# Patient Record
Sex: Male | Born: 1975 | Race: Black or African American | Hispanic: No | Marital: Single | Smoking: Current every day smoker
Health system: Southern US, Community
[De-identification: ages and names within clinical notes are randomized; demographics above are authoritative.]

## PROBLEM LIST (undated history)

## (undated) DIAGNOSIS — Z720 Tobacco use: Secondary | ICD-10-CM

## (undated) DIAGNOSIS — G8929 Other chronic pain: Secondary | ICD-10-CM

## (undated) DIAGNOSIS — I10 Essential (primary) hypertension: Secondary | ICD-10-CM

## (undated) DIAGNOSIS — M549 Dorsalgia, unspecified: Secondary | ICD-10-CM

## (undated) HISTORY — PX: BACK SURGERY: SHX140

---

## 2016-09-12 ENCOUNTER — Emergency Department (HOSPITAL_COMMUNITY)
Admission: EM | Admit: 2016-09-12 | Discharge: 2016-09-12 | Disposition: A | Discharge status: Home / Self Care | Payer: Medicaid Other | Attending: Emergency Medicine | Admitting: Emergency Medicine

## 2016-09-12 ENCOUNTER — Encounter (HOSPITAL_COMMUNITY): Payer: Self-pay

## 2016-09-12 DIAGNOSIS — F17210 Nicotine dependence, cigarettes, uncomplicated: Secondary | ICD-10-CM | POA: Diagnosis not present

## 2016-09-12 DIAGNOSIS — K0889 Other specified disorders of teeth and supporting structures: Secondary | ICD-10-CM | POA: Insufficient documentation

## 2016-09-12 MED ORDER — PENICILLIN V POTASSIUM 500 MG PO TABS: 500.0000 mg | ORAL_TABLET | Freq: Four times a day (QID) | ORAL | 0 refills | Status: DC

## 2016-09-12 MED ORDER — OMEPRAZOLE 20 MG PO CPDR: 20.0000 mg | DELAYED_RELEASE_CAPSULE | Freq: Every day | ORAL | 0 refills | Status: DC

## 2016-09-12 NOTE — ED Triage Notes (Signed)
Pt reports right side dental pain. He reports eating something last week and tooth has been sore since. He reports pain on the side of his face as well.

## 2016-09-12 NOTE — ED Notes (Signed)
See PA note for assessment  

## 2016-09-12 NOTE — ED Provider Notes (Signed)
[facility]-EMERGENCY DEPT Provider Note   CSN: [id number] Arrival date & time: 09/12/16  1054  By signing my name below, I, Alyssa GroveMartin Green, attest that this documentation has been prepared under the direction and in the presence of Roxy Horsemanobert Lenisha Lacap, PA-C. Electronically Signed: Alyssa GroveMartin Green, ED Scribe. 09/12/16. 11:44 AM.  History   Chief Complaint Chief Complaint  Patient presents with  . Dental Pain   The history is provided by the patient. No language interpreter was used.   HPI Comments: Curtis Gill is a 40 y.o. male who presents to the Emergency Department complaining of gradual onset, constant, moderate right upper dental pain for 3 weeks PTA. He states he at 3 days ago and his tooth has been sore since then. Pt is not from [place] and does not return to [place] to see his dentist until 09/16/2016 at the earliest. He denies allergies to antibiotics. Pt has tried Ibuprofen with no relief, but notes Ibuprofen causes an upset stomach.  History reviewed. No pertinent past medical history.  There are no active problems to display for this patient.   History reviewed. No pertinent surgical history.     Home Medications    Prior to Admission medications   Not on File    Family History History reviewed. No pertinent family history.  Social History Social History  Substance Use Topics  . Smoking status: Current Every Day Smoker    Packs/day: 0.50    Types: Cigarettes  . Smokeless tobacco: Never Used  . Alcohol use Yes     Comment: occ     Allergies   Ibuprofen   Review of Systems Review of Systems  Constitutional: Negative for fever.  HENT: Positive for dental problem.      Physical Exam Updated Vital Signs BP (!) 142/106 (BP Location: Left Arm)   Pulse 72   Temp 98.4 F (36.9 C) (Oral)   Resp 18   Ht 6\' 5"  (1.956 m)   Wt 220 lb (99.8 kg)   SpO2 100%   BMI 26.09 kg/m   Physical Exam Physical Exam  Constitutional: Pt appears well-developed and  well-nourished.  HENT:  Head: Normocephalic.  Right Ear: Tympanic membrane, external ear and ear canal normal.  Left Ear: Tympanic membrane, external ear and ear canal normal.  Nose: Nose normal. Right sinus exhibits no maxillary sinus tenderness and no frontal sinus tenderness. Left sinus exhibits no maxillary sinus tenderness and no frontal sinus tenderness.  Mouth/Throat: Uvula is midline, oropharynx is clear and moist and mucous membranes are normal. No oral lesions. No uvula swelling or lacerations. No oropharyngeal exudate, posterior oropharyngeal edema, posterior oropharyngeal erythema or tonsillar abscesses.  Poor dentition No gingival swelling, fluctuance or induration No gross abscess  No sublingual edema, tenderness to palpation, or sign of Ludwig's angina, or deep space infection Pain at right upper 1st molar Eyes: Conjunctivae are normal. Pupils are equal, round, and reactive to light. Right eye exhibits no discharge. Left eye exhibits no discharge.  Neck: Normal range of motion. Neck supple.  No stridor Handling secretions without difficulty No nuchal rigidity No cervical lymphadenopathy Cardiovascular: Normal rate, regular rhythm and normal heart sounds.   Pulmonary/Chest: Effort normal. No respiratory distress.  Equal chest rise  Abdominal: Soft. Bowel sounds are normal. Pt exhibits no distension. There is no tenderness.  Lymphadenopathy: Pt has no cervical adenopathy.  Neurological: Pt is alert and oriented x 4  Skin: Skin is warm and dry.  Psychiatric: Pt has a normal mood and affect.  Nursing  note and vitals reviewed.     ED Treatments / Results  DIAGNOSTIC STUDIES: Oxygen Saturation is 100% on RA, normal by my interpretation.    COORDINATION OF CARE: 11:42 AM Discussed treatment plan with pt at bedside which includes Antibiotics and Prilosec and pt agreed to plan.  Labs (all labs ordered are listed, but only abnormal results are displayed) Labs Reviewed -  No data to display  EKG  EKG Interpretation None       Radiology No results found.  Procedures Procedures (including critical care time)  Medications Ordered in ED Medications - No data to display   Initial Impression / Assessment and Plan / ED Course  I have reviewed the triage vital signs and the nursing notes.  Pertinent labs & imaging results that were available during my care of the patient were reviewed by me and considered in my medical decision making (see chart for details).  Clinical Course     Patient with dentalgia.  No abscess requiring immediate incision and drainage.  Exam not concerning for Ludwig's angina or pharyngeal abscess.  Will treat with penicillin. Pt instructed to follow-up with dentist.  Discussed return precautions. Pt safe for discharge.   Final Clinical Impressions(s) / ED Diagnoses   Final diagnoses:  Pain, dental    New Prescriptions Discharge Medication List as of 09/12/2016 11:45 AM    START taking these medications   Details  omeprazole (PRILOSEC) 20 MG capsule Take 1 capsule (20 mg total) by mouth daily., Starting Fri 09/12/2016, Print    penicillin v potassium (VEETID) 500 MG tablet Take 1 tablet (500 mg total) by mouth 4 (four) times daily., Starting Fri 09/12/2016, Print       I personally performed the services described in this documentation, which was scribed in my presence. The recorded information has been reviewed and is accurate.      Roxy Horsemanobert Xaden Kaufman, PA-C 09/12/16 1156    Mancel BaleElliott Wentz, MD 09/12/16 [phone]

## 2018-02-15 ENCOUNTER — Encounter (HOSPITAL_COMMUNITY): Payer: Self-pay | Admitting: Emergency Medicine

## 2018-02-15 ENCOUNTER — Emergency Department (HOSPITAL_COMMUNITY)
Admission: EM | Admit: 2018-02-15 | Discharge: 2018-02-15 | Disposition: A | Discharge status: Home / Self Care | Payer: Medicaid Other | Attending: Emergency Medicine | Admitting: Emergency Medicine

## 2018-02-15 DIAGNOSIS — Z79899 Other long term (current) drug therapy: Secondary | ICD-10-CM | POA: Diagnosis not present

## 2018-02-15 DIAGNOSIS — F17210 Nicotine dependence, cigarettes, uncomplicated: Secondary | ICD-10-CM | POA: Diagnosis not present

## 2018-02-15 DIAGNOSIS — M545 Low back pain, unspecified: Secondary | ICD-10-CM

## 2018-02-15 MED ORDER — PREDNISONE 10 MG (21) PO TBPK: ORAL_TABLET | Freq: Every day | ORAL | 0 refills | Status: DC

## 2018-02-15 MED ORDER — METHOCARBAMOL 500 MG PO TABS: 500.0000 mg | ORAL_TABLET | Freq: Every evening | ORAL | 0 refills | Status: DC | PRN

## 2018-02-15 NOTE — ED Triage Notes (Signed)
Patient to ED c/o worsening back pain x 3 days - states he has chronic lower back pain r/t previous herniated lumbar discs/surgeries. Denies new injuries/trauma. Patient states his R leg feels weaker like it did before the surgery a few years ago. Denies numbness/tingling, no urinary or bowel incontinence. Patient ambulatory with steady gait.

## 2018-02-15 NOTE — ED Provider Notes (Signed)
[facility] Provider Note   CSN: [id number] Arrival date & time: 02/15/18  1804     History   Chief Complaint Chief Complaint  Patient presents with  . Back Pain    HPI Curtis Gill is a 42 y.o. male presented for evaluation of low back pain.  Patient states for the past week, he has had worsening low back pain.  This began when he woke up one morning.  He denies fall, trauma, or injury.  He has a history of back problems, had surgery for 2 herniated disks 3 years ago.  He states his pain feels similar to how it did in the past.  Pain is midline and on the right side.  He denies radiation to his legs.  He denies fevers, chills, rash, loss of bowel or bladder control, numbness, weakness, history of cancer, or history of IV drug use.  He has taken BC powders and Aleve with mild improvement of his symptoms.  [place] was [place] in [place], he does not have an orthopedic doctor or a primary care doctor in [place].  Pain is worse when he is going from lying to standing, improves when he ambulates and loosens his muscles up.  HPI  History reviewed. No pertinent past medical history.  There are no active problems to display for this patient.   History reviewed. No pertinent surgical history.      Home Medications    Prior to Admission medications   Medication Sig Start Date End Date Taking? Authorizing Provider  methocarbamol (ROBAXIN) 500 MG tablet Take 1 tablet (500 mg total) by mouth at bedtime as needed for muscle spasms. 02/15/18   Ceyda Peterka, PA-C  omeprazole (PRILOSEC) 20 MG capsule Take 1 capsule (20 mg total) by mouth daily. 09/12/16   Roxy HorsemanBrowning, Robert, PA-C  penicillin v potassium (VEETID) 500 MG tablet Take 1 tablet (500 mg total) by mouth 4 (four) times daily. 09/12/16   Roxy HorsemanBrowning, Robert, PA-C  predniSONE (STERAPRED UNI-PAK 21 TAB) 10 MG (21) TBPK tablet Take by mouth daily. Take 6 tabs by mouth daily  for 2 days,  then 5 tabs for 2 days, then 4 tabs for 2 days, then 3 tabs for 2 days, 2 tabs for 2 days, then 1 tab by mouth daily for 2 days 02/15/18   Shaurya Rawdon, PA-C    Family History No family history on file.  Social History Social History   Tobacco Use  . Smoking status: Current Every Day Smoker    Packs/day: 0.50    Types: Cigarettes  . Smokeless tobacco: Never Used  Substance Use Topics  . Alcohol use: Yes    Comment: occ  . Drug use: Not on file     Allergies   Ibuprofen   Review of Systems Review of Systems  Genitourinary: Negative for dysuria, frequency and hematuria.  Musculoskeletal: Positive for back pain.  Neurological: Negative for weakness and numbness.     Physical Exam Updated Vital Signs BP (!) 147/98   Pulse 78   Temp 98.4 F (36.9 C) (Oral)   Resp 16   Ht 6\' 5"  (1.956 m)   Wt 90.7 kg (200 lb)   SpO2 98%   BMI 23.72 kg/m   Physical Exam  Constitutional: He is oriented to person, place, and time. He appears well-developed and well-nourished. No distress.  HENT:  Head: Normocephalic and atraumatic.  Eyes: Pupils are equal, round, and reactive to light. EOM are normal.  Neck:  Normal range of motion.  Cardiovascular: Normal rate, regular rhythm and intact distal pulses.  Pulmonary/Chest: Effort normal and breath sounds normal. No respiratory distress. He has no wheezes.  Abdominal: Soft. He exhibits no distension. There is no tenderness.  Musculoskeletal: Normal range of motion. He exhibits tenderness.  Tenderness to palpation of low back along midline and right side.  No obvious deformity or step-offs.  No pain on the left side.  No pain with straight leg raise.  Pedal pulses intact bilaterally.  Sensation intact bilaterally.  DTRs symmetric bilaterally.  Soft compartments.  Patient is ambulatory.  Strength of lower extremtities equal bilaterally.  Neurological: He is alert and oriented to person, place, and time. No sensory deficit.  Skin: Skin is  warm. Capillary refill takes less than 2 seconds. No rash noted.  Psychiatric: He has a normal mood and affect.  Nursing note and vitals reviewed.    ED Treatments / Results  Labs (all labs ordered are listed, but only abnormal results are displayed) Labs Reviewed - No data to display  EKG None  Radiology No results found.  Procedures Procedures (including critical care time)  Medications Ordered in ED Medications - No data to display   Initial Impression / Assessment and Plan / ED Course  I have reviewed the triage vital signs and the nursing notes.  Pertinent labs & imaging results that were available during my care of the patient were reviewed by me and considered in my medical decision making (see chart for details).     Patient presenting for evaluation of low back pain.  Physical exam reassuring, neurovascularly intact.  No red flags for back pain.  Pain is reproducible with palpation of the musculature.  Likely musculoskeletal.  Doubt fracture, I do not believe x-rays will be beneficial.  Doubt vertebral injury, infection, spinal cord compression, myelopathy, or cauda equina syndrome.  Will treat symptomatically with prednisone pack, muscle relaxers, and stretches.  Patient given information to follow-up with orthopedics.  At this time, patient appears safe for discharge.  Return precautions given.  Patient states he understands agrees plan.   Final Clinical Impressions(s) / ED Diagnoses   Final diagnoses:  Acute right-sided low back pain without sciatica    ED Discharge Orders        Ordered    predniSONE (STERAPRED UNI-PAK 21 TAB) 10 MG (21) TBPK tablet  Daily,   Status:  Discontinued     02/15/18 2013    methocarbamol (ROBAXIN) 500 MG tablet  At bedtime PRN,   Status:  Discontinued     02/15/18 2013    methocarbamol (ROBAXIN) 500 MG tablet  At bedtime PRN     02/15/18 2015    predniSONE (STERAPRED UNI-PAK 21 TAB) 10 MG (21) TBPK tablet  Daily     02/15/18  2015       Knolan Simien, PA-C 02/15/18 2042    Mancel Bale, MD 02/23/18 [phone]

## 2018-02-15 NOTE — Discharge Instructions (Signed)
Take steroids as prescribed.  Take the entire course. Use muscle relaxers as needed for pain.  Have caution, as this may make you tired or groggy.  Do not drive or operate heavy machinery while taking this medicine. Use Tylenol as needed for pain.  You may take up to thousand milligrams 3 times a day. Do the back stretches that are provided in the paperwork. Use heat and/or ice, whichever makes your back feel better. Follow-up with orthopedics if your pain is not improving. Return to the emergency room if you develop fevers, loss of bowel or bladder control, numbness, or any new or concerning symptoms.

## 2018-02-18 ENCOUNTER — Emergency Department (HOSPITAL_COMMUNITY): Payer: Medicaid Other

## 2018-02-18 ENCOUNTER — Other Ambulatory Visit: Payer: Self-pay

## 2018-02-18 ENCOUNTER — Observation Stay (HOSPITAL_COMMUNITY)
Admission: EM | Admit: 2018-02-18 | Discharge: 2018-02-20 | Disposition: A | Discharge status: Home / Self Care | Payer: Medicaid Other | Attending: Internal Medicine | Admitting: Internal Medicine

## 2018-02-18 ENCOUNTER — Encounter (HOSPITAL_COMMUNITY): Payer: Self-pay | Admitting: Emergency Medicine

## 2018-02-18 DIAGNOSIS — R197 Diarrhea, unspecified: Secondary | ICD-10-CM | POA: Insufficient documentation

## 2018-02-18 DIAGNOSIS — R55 Syncope and collapse: Secondary | ICD-10-CM | POA: Insufficient documentation

## 2018-02-18 DIAGNOSIS — G8929 Other chronic pain: Secondary | ICD-10-CM | POA: Diagnosis not present

## 2018-02-18 DIAGNOSIS — Z720 Tobacco use: Secondary | ICD-10-CM | POA: Diagnosis present

## 2018-02-18 DIAGNOSIS — R001 Bradycardia, unspecified: Secondary | ICD-10-CM | POA: Diagnosis not present

## 2018-02-18 DIAGNOSIS — M549 Dorsalgia, unspecified: Secondary | ICD-10-CM | POA: Insufficient documentation

## 2018-02-18 DIAGNOSIS — F17210 Nicotine dependence, cigarettes, uncomplicated: Secondary | ICD-10-CM | POA: Diagnosis not present

## 2018-02-18 DIAGNOSIS — I10 Essential (primary) hypertension: Secondary | ICD-10-CM | POA: Insufficient documentation

## 2018-02-18 DIAGNOSIS — M545 Low back pain: Secondary | ICD-10-CM

## 2018-02-18 DIAGNOSIS — R110 Nausea: Secondary | ICD-10-CM | POA: Diagnosis present

## 2018-02-18 DIAGNOSIS — R112 Nausea with vomiting, unspecified: Secondary | ICD-10-CM | POA: Insufficient documentation

## 2018-02-18 HISTORY — DX: Tobacco use: Z72.0

## 2018-02-18 HISTORY — DX: Dorsalgia, unspecified: M54.9

## 2018-02-18 HISTORY — DX: Other chronic pain: G89.29

## 2018-02-18 LAB — URINALYSIS, ROUTINE W REFLEX MICROSCOPIC
Bilirubin Urine: NEGATIVE
Glucose, UA: NEGATIVE mg/dL
Ketones, ur: NEGATIVE mg/dL
Leukocytes, UA: NEGATIVE
Nitrite: NEGATIVE
PROTEIN: 30 mg/dL — AB
Specific Gravity, Urine: 1.026 (ref 1.005–1.030)
pH: 6 (ref 5.0–8.0)

## 2018-02-18 LAB — I-STAT TROPONIN, ED: TROPONIN I, POC: 0 ng/mL (ref 0.00–0.08)

## 2018-02-18 LAB — HEPATIC FUNCTION PANEL
ALT: 22 U/L (ref 17–63)
AST: 21 U/L (ref 15–41)
Albumin: 4.2 g/dL (ref 3.5–5.0)
Alkaline Phosphatase: 46 U/L (ref 38–126)
BILIRUBIN TOTAL: 0.5 mg/dL (ref 0.3–1.2)
Total Protein: 7.4 g/dL (ref 6.5–8.1)

## 2018-02-18 LAB — BASIC METABOLIC PANEL
ANION GAP: 9 (ref 5–15)
BUN: 10 mg/dL (ref 6–20)
CO2: 27 mmol/L (ref 22–32)
Calcium: 10 mg/dL (ref 8.9–10.3)
Chloride: 105 mmol/L (ref 101–111)
Creatinine, Ser: 0.9 mg/dL (ref 0.61–1.24)
GFR calc Af Amer: >60 mL/min (ref >60)
GFR calc non Af Amer: >60 mL/min (ref >60)
GLUCOSE: 97 mg/dL (ref 65–99)
Potassium: 4.2 mmol/L (ref 3.5–5.1)
Sodium: 141 mmol/L (ref 135–145)

## 2018-02-18 LAB — CBG MONITORING, ED: GLUCOSE-CAPILLARY: 119 mg/dL — AB (ref 65–99)

## 2018-02-18 LAB — CBC
HCT: 43.7 % (ref 39.0–52.0)
HEMOGLOBIN: 14.7 g/dL (ref 13.0–17.0)
MCH: 29.8 pg (ref 26.0–34.0)
MCHC: 33.6 g/dL (ref 30.0–36.0)
MCV: 88.6 fL (ref 78.0–100.0)
Platelets: 365 10*3/uL (ref 150–400)
RBC: 4.93 MIL/uL (ref 4.22–5.81)
RDW: 13.6 % (ref 11.5–15.5)
WBC: 12.4 10*3/uL — ABNORMAL HIGH (ref 4.0–10.5)

## 2018-02-18 LAB — TSH: TSH: 0.691 u[IU]/mL (ref 0.350–4.500)

## 2018-02-18 LAB — LIPASE, BLOOD: Lipase: 25 U/L (ref 11–51)

## 2018-02-18 MED ORDER — SODIUM CHLORIDE 0.9 % IV SOLN
INTRAVENOUS | Status: DC
  Administered 2018-02-18: 23:00:00 via INTRAVENOUS

## 2018-02-18 MED ORDER — METOCLOPRAMIDE HCL 5 MG/ML IJ SOLN
10.0000 mg | Freq: Once | INTRAMUSCULAR | Status: AC
  Administered 2018-02-18: 10 mg via INTRAVENOUS
  Filled 2018-02-18: qty 2

## 2018-02-18 MED ORDER — LACTATED RINGERS IV BOLUS
1000.0000 mL | Freq: Once | INTRAVENOUS | Status: AC
  Administered 2018-02-18: 1000 mL via INTRAVENOUS

## 2018-02-18 MED ORDER — ONDANSETRON 4 MG PO TBDP
4.0000 mg | ORAL_TABLET | Freq: Three times a day (TID) | ORAL | Status: DC | PRN
  Filled 2018-02-18: qty 1

## 2018-02-18 MED ORDER — FAMOTIDINE IN NACL 20-0.9 MG/50ML-% IV SOLN
20.0000 mg | Freq: Once | INTRAVENOUS | Status: AC
  Administered 2018-02-18: 20 mg via INTRAVENOUS
  Filled 2018-02-18: qty 50

## 2018-02-18 MED ORDER — ZOLPIDEM TARTRATE 5 MG PO TABS: 5.0000 mg | ORAL_TABLET | Freq: Every evening | ORAL | Status: DC | PRN

## 2018-02-18 MED ORDER — OXYCODONE-ACETAMINOPHEN 5-325 MG PO TABS
1.0000 | ORAL_TABLET | ORAL | Status: DC | PRN
  Administered 2018-02-19 (×2): 1 via ORAL
  Filled 2018-02-18 (×2): qty 1

## 2018-02-18 MED ORDER — DIPHENHYDRAMINE HCL 25 MG PO CAPS
25.0000 mg | ORAL_CAPSULE | Freq: Two times a day (BID) | ORAL | Status: DC
  Administered 2018-02-18 – 2018-02-20 (×4): 25 mg via ORAL
  Filled 2018-02-18 (×4): qty 1

## 2018-02-18 MED ORDER — SODIUM CHLORIDE 0.9 % IV BOLUS
1000.0000 mL | Freq: Once | INTRAVENOUS | Status: AC
  Administered 2018-02-18: 1000 mL via INTRAVENOUS

## 2018-02-18 MED ORDER — ACETAMINOPHEN 325 MG PO TABS
650.0000 mg | ORAL_TABLET | Freq: Four times a day (QID) | ORAL | Status: DC | PRN
  Administered 2018-02-19: 650 mg via ORAL
  Filled 2018-02-18: qty 2

## 2018-02-18 MED ORDER — ONDANSETRON 4 MG PO TBDP
4.0000 mg | ORAL_TABLET | Freq: Once | ORAL | Status: AC
  Administered 2018-02-18: 4 mg via ORAL
  Filled 2018-02-18: qty 1

## 2018-02-18 MED ORDER — FAMOTIDINE IN NACL 20-0.9 MG/50ML-% IV SOLN
20.0000 mg | Freq: Two times a day (BID) | INTRAVENOUS | Status: DC
  Administered 2018-02-19: 20 mg via INTRAVENOUS
  Filled 2018-02-18: qty 50

## 2018-02-18 MED ORDER — NICOTINE 21 MG/24HR TD PT24
21.0000 mg | MEDICATED_PATCH | Freq: Every day | TRANSDERMAL | Status: DC
  Administered 2018-02-19 – 2018-02-20 (×2): 21 mg via TRANSDERMAL
  Filled 2018-02-18 (×2): qty 1

## 2018-02-18 NOTE — ED Notes (Signed)
Patient actively vomiting.  Will provide protocol zofran

## 2018-02-18 NOTE — H&P (Signed)
History and Physical    Trueman Worlds ZOX:[id number] DOB: [birth year] DOA: 02/18/2018  Referring MD/NP/PA:   PCP: Patient, No Pcp Per   Patient coming from:  The patient is coming from home.  At baseline, pt is independent for most of ADL.     Chief Complaint: Nausea, vomiting, diarrhea  HPI: Curtis Gill is a 42 y.o. male with medical history significant of chronic back pain, GERD, tobacco abuse, who presents with nausea, vomiting, diarrhea.  Patient states that he has chronic lower back pain, which has worsened recently.  Patient was seen in ED on 02/15/2018, and was put on Robaxin and prednisone.  Patient states that he developed nausea, vomiting and diarrhea after starting taking this medications.  He vomited 5 times today.  He had a 2 loose stool bowel movement today.  Patient has some mild abdominal cramping and discomfort earlier, which has resolved.  Currently no abdominal pain.  Patient does not have fever, but has chills.  She has sweating.  Patient states that he had similar reaction to steroid in the past when he get steroid injection to lower back.  Denies any chest pain, shortness breath, cough.  No symptoms of UTI or unilateral weakness.  No recent antibiotics use. Pt states that he had lightheadedness and felt going to pass out but did not.  No unilateral weakness, numbness or tingling his extremities.  No facial droop or slurred speech.    ED Course: pt was found to have WBC 12.4, negative troponin, lipase 25, negative urinalysis, electrolytes renal function okay, bradycardia with heart rate 40 to 50s, negative chest x-ray.  Patient is placed on telemetry bed for observation.  Review of Systems:   General: no fevers, has chills, no body weight gain, has poor appetite, has fatigue HEENT: no blurry vision, hearing changes or sore throat Respiratory: no dyspnea, coughing, wheezing CV: no chest pain, no palpitations GI: has nausea, vomiting, diarrhea, no constipation and bdominal  pain,  GU: no dysuria, burning on urination, increased urinary frequency, hematuria  Ext: no leg edema Neuro: no unilateral weakness, numbness, or tingling, no vision change or hearing loss. Has lightheadedness and near syncope. Skin: no rash, no skin tear. MSK: No muscle spasm, no deformity, no limitation of range of movement in spin Heme: No easy bruising.  Travel history: No recent long distant travel.  Allergy:  Allergies  Allergen Reactions  . Prednisone     Nausea, vomiting, diarrhea   . Ibuprofen Nausea Only    Stomach upset     Past Medical History:  Diagnosis Date  . Chronic back pain   . Tobacco abuse     Past Surgical History:  Procedure Laterality Date  . BACK SURGERY      Social History:  reports that he has been smoking cigarettes.  He has been smoking about 0.50 packs per day. He has never used smokeless tobacco. He reports that he drinks alcohol. He reports that he has current or past drug history.  Family History:  Family History  Problem Relation Age of Onset  . Hyperlipidemia Mother      Prior to Admission medications   Medication Sig Start Date End Date Taking? Authorizing Provider  methocarbamol (ROBAXIN) 500 MG tablet Take 1 tablet (500 mg total) by mouth at bedtime as needed for muscle spasms. 02/15/18  Yes Caccavale, Sophia, PA-C  omeprazole (PRILOSEC) 20 MG capsule Take 1 capsule (20 mg total) by mouth daily. 09/12/16  Yes Roxy Horseman, PA-C  predniSONE (STERAPRED Carlus Pavlov  21 TAB) 10 MG (21) TBPK tablet Take by mouth daily. Take 6 tabs by mouth daily  for 2 days, then 5 tabs for 2 days, then 4 tabs for 2 days, then 3 tabs for 2 days, 2 tabs for 2 days, then 1 tab by mouth daily for 2 days 02/15/18  Yes Caccavale, Sophia, PA-C  penicillin v potassium (VEETID) 500 MG tablet Take 1 tablet (500 mg total) by mouth 4 (four) times daily. Patient not taking: Reported on 02/18/2018 09/12/16   Roxy HorsemanBrowning, Robert, PA-C    Physical Exam: Vitals:   02/18/18  2215 02/18/18 2223 02/18/18 2230 02/18/18 2300  BP: 123/72  128/73 130/72  Pulse: (!) 48 (!) 44 (!) 46 (!) 53  Resp: 16 14 15  (!) 97  Temp:   98.1 F (36.7 C) 98.1 F (36.7 C)  TempSrc:   Oral   SpO2: 98% 100% 98% 99%  Weight:    88.8 kg (195 lb 12.8 oz)  Height:    6\' 5"  (1.956 m)   General: Not in acute distress.  HEENT:       Eyes: PERRL, EOMI, no scleral icterus.       ENT: No discharge from the ears and nose, no pharynx injection, no tonsillar enlargement.        Neck: No JVD, no bruit, no mass felt. Heme: No neck lymph node enlargement. Cardiac: S1/S2, RRR, No murmurs, No gallops or rubs. Respiratory: No rales, wheezing, rhonchi or rubs. GI: Soft, nondistended, nontender, no rebound pain, no organomegaly, BS present. GU: No hematuria Ext: No pitting leg edema bilaterally. 2+DP/PT pulse bilaterally. Musculoskeletal: No joint deformities, No joint redness or warmth, no limitation of ROM in spin. Skin: No rashes.  Neuro: Alert, oriented X3, cranial nerves II-XII grossly intact, moves all extremities normally. Muscle strength 5/5 in all extremities, sensation to light touch intact. Brachial reflex 2+ bilaterally. Negative Babinski's sign.  Psych: Patient is not psychotic, no suicidal or hemocidal ideation.  Labs on Admission: I have personally reviewed following labs and imaging studies  CBC: Recent Labs  Lab 02/18/18 1116  WBC 12.4*  HGB 14.7  HCT 43.7  MCV 88.6  PLT 365   Basic Metabolic Panel: Recent Labs  Lab 02/18/18 1116  NA 141  K 4.2  CL 105  CO2 27  GLUCOSE 97  BUN 10  CREATININE 0.90  CALCIUM 10.0   GFR: Estimated Creatinine Clearance: 135.7 mL/min (by C-G formula based on SCr of 0.9 mg/dL). Liver Function Tests: Recent Labs  Lab 02/18/18 1608  AST 21  ALT 22  ALKPHOS 46  BILITOT 0.5  PROT 7.4  ALBUMIN 4.2   Recent Labs  Lab 02/18/18 1608  LIPASE 25   No results for input(s): AMMONIA in the last 168 hours. Coagulation Profile: No  results for input(s): INR, PROTIME in the last 168 hours. Cardiac Enzymes: No results for input(s): CKTOTAL, CKMB, CKMBINDEX, TROPONINI in the last 168 hours. BNP (last 3 results) No results for input(s): PROBNP in the last 8760 hours. HbA1C: No results for input(s): HGBA1C in the last 72 hours. CBG: Recent Labs  Lab 02/18/18 1558  GLUCAP 119*   Lipid Profile: No results for input(s): CHOL, HDL, LDLCALC, TRIG, CHOLHDL, LDLDIRECT in the last 72 hours. Thyroid Function Tests: Recent Labs    02/18/18 1754  TSH 0.691   Anemia Panel: No results for input(s): VITAMINB12, FOLATE, FERRITIN, TIBC, IRON, RETICCTPCT in the last 72 hours. Urine analysis:    Component Value Date/Time  COLORURINE YELLOW 02/18/2018 1744   APPEARANCEUR CLOUDY (A) 02/18/2018 1744   LABSPEC 1.026 02/18/2018 1744   PHURINE 6.0 02/18/2018 1744   GLUCOSEU NEGATIVE 02/18/2018 1744   HGBUR SMALL (A) 02/18/2018 1744   BILIRUBINUR NEGATIVE 02/18/2018 1744   KETONESUR NEGATIVE 02/18/2018 1744   PROTEINUR 30 (A) 02/18/2018 1744   NITRITE NEGATIVE 02/18/2018 1744   LEUKOCYTESUR NEGATIVE 02/18/2018 1744   Sepsis Labs: @LABRCNTIP (procalcitonin:4,lacticidven:4) )No results found for this or any previous visit (from the past 240 hour(s)).   Radiological Exams on Admission: Dg Chest Port 1 View  Result Date: 02/18/2018 CLINICAL DATA:  Bradycardia.  Weakness and dizziness. EXAM: PORTABLE CHEST 1 VIEW COMPARISON:  None. FINDINGS: The heart size and mediastinal contours are within normal limits. Both lungs are clear. The visualized skeletal structures are unremarkable. IMPRESSION: No active disease. Electronically Signed   By: Obie Dredge M.D.   On: 02/18/2018 16:00     EKG: Independently reviewed.  Sinus rhythm, bradycardia, QTC 386, no ischemic change.  Assessment/Plan Principal Problem:   Nausea vomiting and diarrhea Active Problems:   Chronic back pain   Tobacco abuse   Bradycardia   Near  syncope   Nausea vomiting and diarrhea: Etiology is not clear. Patient states that this is likely due to allergic reaction to prednisone since patient had similar reaction to steroid in the past.  Another possibility is viral gastroenteritis.  No recent antibiotics use.  No significant abdominal pain, low suspicion for C. difficile colitis.  -Placed on telemetry bed for observation -Hold prednisone and Robaxin -IV fluid: 1 L normal saline, 1 L Ringer's solution, followed by 150 cc/h -PRN Zofran for nausea. -check GI pathogen panel -Benadryl 25 mg twice daily and Pepcid 20 mg twice daily -f/u Blood culture due to leukocytosis.  Chronic back pain: -hold prednisone and Robaxin -PRN Tylenol and Percocet  Tobacco abuse: -Did counseling about importance of quitting smoking -Nicotine patch  Bradycardia and Near syncope: Patient had near syncope episode, which is most likely due to dehydration or orthostatic status secondary to dehydration, but since patient also has bradycardia, will get 2D echo to rule out any structural abnormality. -check orthostatic vital signs -IV fluid as above -2D echo -check TSH and free T4  DVT ppx: SQ Lovenox Code Status: Full code Family Communication: None at bed side.   Disposition Plan:  Anticipate discharge back to previous home environment Consults called:  none Admission status: Obs / tele     Date of Service 02/19/2018    Lorretta Harp [facility] Pager [phone]  If 7PM-7AM, please contact night-coverage [email or web address] Password [email or web address] 02/19/2018, 1:45 AM

## 2018-02-18 NOTE — ED Notes (Signed)
Pt appears more comfortable now since receiving Liter bolus and reglan. Pt still complains of some pain. Pt's HR still in the 40s sinus brady

## 2018-02-18 NOTE — ED Notes (Signed)
Pt still appears diaphoretic and HR still dropping into the 40s. 1L lactated ringers started. Pt axox4.

## 2018-02-18 NOTE — ED Triage Notes (Signed)
Pt states he was seen here a few days ago for back pain and was given prednisone and robaxin for his treatment. Pt started having abd cramping, n/v/diarrhea. Pt having hot/cold sweats, feeling like he may pass out. Concerned he is having a reaction to the medication. States when he was previously on a steroid he felt the same.

## 2018-02-18 NOTE — ED Provider Notes (Signed)
[facility]   CSN: [id number] Arrival date & time: 02/18/18  1017     History   Chief Complaint Chief Complaint  Patient presents with  . Nausea  . Emesis  . Near Syncope    HPI Curtis Gill is a 42 y.o. male.  The history is provided by the patient.  Emesis   This is a new problem. The current episode started 2 days ago. The problem occurs 2 to 4 times per day. The problem has been gradually worsening. The emesis has an appearance of stomach contents. There has been no fever. Associated symptoms include abdominal pain, chills, diarrhea and sweats. Pertinent negatives include no cough and no URI. Associated symptoms comments: No chest pain or SOB.  Feels like he might pass out but has not lost consciousness. Risk factors: no recent travel, abx or bad food exposure.  No sick contacts.  think this is from prednsione which he started for back pain 2 days ago.  Near Syncope  Associated symptoms include abdominal pain.    History reviewed. No pertinent past medical history.  There are no active problems to display for this patient.   Past Surgical History:  Procedure Laterality Date  . BACK SURGERY          Home Medications    Prior to Admission medications   Medication Sig Start Date End Date Taking? Authorizing Provider  methocarbamol (ROBAXIN) 500 MG tablet Take 1 tablet (500 mg total) by mouth at bedtime as needed for muscle spasms. 02/15/18   Caccavale, Sophia, PA-C  omeprazole (PRILOSEC) 20 MG capsule Take 1 capsule (20 mg total) by mouth daily. 09/12/16   Roxy HorsemanBrowning, Robert, PA-C  penicillin v potassium (VEETID) 500 MG tablet Take 1 tablet (500 mg total) by mouth 4 (four) times daily. 09/12/16   Roxy HorsemanBrowning, Robert, PA-C  predniSONE (STERAPRED UNI-PAK 21 TAB) 10 MG (21) TBPK tablet Take by mouth daily. Take 6 tabs by mouth daily  for 2 days, then 5 tabs for 2 days, then 4 tabs for 2 days, then 3 tabs for 2 days, 2 tabs  for 2 days, then 1 tab by mouth daily for 2 days 02/15/18   Caccavale, Sophia, PA-C    Family History History reviewed. No pertinent family history.  Social History Social History   Tobacco Use  . Smoking status: Current Every Day Smoker    Packs/day: 0.50    Types: Cigarettes  . Smokeless tobacco: Never Used  Substance Use Topics  . Alcohol use: Yes    Comment: occ  . Drug use: Not on file     Allergies   Ibuprofen   Review of Systems Review of Systems  Constitutional: Positive for chills.  Respiratory: Negative for cough.   Cardiovascular: Positive for near-syncope.  Gastrointestinal: Positive for abdominal pain, diarrhea and vomiting.  All other systems reviewed and are negative.    Physical Exam Updated Vital Signs BP (!) 159/84 (BP Location: Left Arm)   Pulse (!) 43   Temp 97.6 F (36.4 C) (Oral)   Resp 14   Ht 6\' 5"  (1.956 m)   Wt 90.7 kg (200 lb)   SpO2 100%   BMI 23.72 kg/m   Physical Exam  Constitutional: He is oriented to person, place, and time. He appears well-developed and well-nourished. No distress.  HENT:  Head: Normocephalic and atraumatic.  Mouth/Throat: Oropharynx is clear and moist. Mucous membranes are dry.  Eyes: Pupils are equal, round, and reactive to  light. Conjunctivae and EOM are normal.  Neck: Normal range of motion. Neck supple.  Cardiovascular: Regular rhythm and intact distal pulses. Bradycardia present.  No murmur heard. Pulmonary/Chest: Effort normal and breath sounds normal. No respiratory distress. He has no wheezes. He has no rales.  Abdominal: Soft. He exhibits no distension. There is tenderness in the epigastric area. There is no rebound and no guarding.  Musculoskeletal: Normal range of motion. He exhibits no edema or tenderness.  Neurological: He is alert and oriented to person, place, and time.  Skin: Skin is warm. No rash noted. He is diaphoretic. No erythema.  Psychiatric: He has a normal mood and affect. His  behavior is normal.  Nursing note and vitals reviewed.    ED Treatments / Results  Labs (all labs ordered are listed, but only abnormal results are displayed) Labs Reviewed  CBC - Abnormal; Notable for the following components:      Result Value   WBC 12.4 (*)    All other components within normal limits  URINALYSIS, ROUTINE W REFLEX MICROSCOPIC - Abnormal; Notable for the following components:   APPearance CLOUDY (*)    Hgb urine dipstick SMALL (*)    Protein, ur 30 (*)    Bacteria, UA RARE (*)    All other components within normal limits  HEPATIC FUNCTION PANEL - Abnormal; Notable for the following components:   Bilirubin, Direct <0.1 (*)    All other components within normal limits  CBG MONITORING, ED - Abnormal; Notable for the following components:   Glucose-Capillary 119 (*)    All other components within normal limits  BASIC METABOLIC PANEL  LIPASE, BLOOD  TSH  T4  I-STAT TROPONIN, ED    EKG EKG Interpretation  Date/Time:  Thursday February 18 2018 10:51:22 EDT Ventricular Rate:  44 PR Interval:  156 QRS Duration: 92 QT Interval:  452 QTC Calculation: 386 R Axis:   82 Text Interpretation:  Marked sinus bradycardia Left ventricular hypertrophy No previous tracing Confirmed by Gwyneth Sprout ([id number]) on 02/18/2018 3:32:32 PM   Radiology Dg Chest Port 1 View  Result Date: 02/18/2018 CLINICAL DATA:  Bradycardia.  Weakness and dizziness. EXAM: PORTABLE CHEST 1 VIEW COMPARISON:  None. FINDINGS: The heart size and mediastinal contours are within normal limits. Both lungs are clear. The visualized skeletal structures are unremarkable. IMPRESSION: No active disease. Electronically Signed   By: Obie Dredge M.D.   On: 02/18/2018 16:00    Procedures Procedures (including critical care time)  Medications Ordered in ED Medications  metoCLOPramide (REGLAN) injection 10 mg (has no administration in time range)  sodium chloride 0.9 % bolus 1,000 mL (has no administration  in time range)  ondansetron (ZOFRAN-ODT) disintegrating tablet 4 mg (4 mg Oral Given 02/18/18 1117)     Initial Impression / Assessment and Plan / ED Course  I have reviewed the triage vital signs and the nursing notes.  Pertinent labs & imaging results that were available during my care of the patient were reviewed by me and considered in my medical decision making (see chart for details).    Patient is a 42 year old male with a history of chronic back pain who presents today with nausea, vomiting, diarrhea, sweats and feeling like he might pass out.  Patient states this started shortly after starting prednisone which she was given for a flare and chronic back pain.  Patient states in the past he thinks he may have had prednisone once before that made him sick.  He has vomited  4-5 times every day and has not been able to hold anything down today.  He has diffuse abdominal discomfort but denies any chest pain, shortness of breath or numbness or tingling.  He has not lost consciousness.  He denies any fever or food borne exposure.  He has had no sick contacts.  On exam patient is extremely diaphoretic and clammy he is bradycardic with a sinus bradycardia in the 40s.  Patient does not take any type of beta-blockers and of note heart rate was in the 70s 2 days ago when he was seen.  Patient takes no other medications except the ones that were prescribed for him 2 days ago.  He is denying any fever or bloody stool or emesis.  Patient does not drink alcohol heavily and does not use drugs but does smoke cigarettes.  His abdominal exam has minimal epigastric discomfort but no other abdominal pain or peritoneal signs concerning for perforation, diverticulitis, pancreatitis, cholecystitis or appendicitis. Unclear the etiology of patient's bradycardia.  Could be from a vagal response from severe nausea and the diaphoresis however could be thyroid related.  No evidence of heart block on EKG.  Lower suspicion for  cardiac etiology. CBC, BMP within normal limits.  LFTs, lipase, TSH, T4 are pending.  Patient given IV fluids and Reglan.  8:23 PM Labs are all reassuring.  On repeat evaluation patient states the nausea is somewhat better but he is still bradycardic and still having diaphoresis.  Again could be a result of prednisone.  It does not appear to be related to electrolytes or his thyroid.  Will admit for observation and ongoing supportive care.  Final Clinical Impressions(s) / ED Diagnoses   Final diagnoses:  Symptomatic sinus bradycardia    ED Discharge Orders    None       Gwyneth Sprout, MD 02/18/18 2302

## 2018-02-18 NOTE — ED Notes (Signed)
CBG:119, RN Caryn BeeKevin notified.

## 2018-02-19 ENCOUNTER — Observation Stay (HOSPITAL_BASED_OUTPATIENT_CLINIC_OR_DEPARTMENT_OTHER): Payer: Medicaid Other

## 2018-02-19 ENCOUNTER — Other Ambulatory Visit: Payer: Self-pay

## 2018-02-19 DIAGNOSIS — G8929 Other chronic pain: Secondary | ICD-10-CM

## 2018-02-19 DIAGNOSIS — M5440 Lumbago with sciatica, unspecified side: Secondary | ICD-10-CM | POA: Diagnosis not present

## 2018-02-19 DIAGNOSIS — R55 Syncope and collapse: Secondary | ICD-10-CM | POA: Diagnosis not present

## 2018-02-19 DIAGNOSIS — R112 Nausea with vomiting, unspecified: Secondary | ICD-10-CM | POA: Diagnosis not present

## 2018-02-19 DIAGNOSIS — I361 Nonrheumatic tricuspid (valve) insufficiency: Secondary | ICD-10-CM | POA: Diagnosis not present

## 2018-02-19 DIAGNOSIS — Z720 Tobacco use: Secondary | ICD-10-CM

## 2018-02-19 DIAGNOSIS — R197 Diarrhea, unspecified: Secondary | ICD-10-CM | POA: Diagnosis not present

## 2018-02-19 DIAGNOSIS — R001 Bradycardia, unspecified: Secondary | ICD-10-CM

## 2018-02-19 LAB — RAPID URINE DRUG SCREEN, HOSP PERFORMED
AMPHETAMINES: NOT DETECTED
Barbiturates: NOT DETECTED
Benzodiazepines: NOT DETECTED
COCAINE: NOT DETECTED
Opiates: NOT DETECTED
TETRAHYDROCANNABINOL: POSITIVE — AB

## 2018-02-19 LAB — ECHOCARDIOGRAM COMPLETE
Height: 77 in
Weight: 3132.8 oz

## 2018-02-19 LAB — T4, FREE: Free T4: 0.96 ng/dL (ref 0.82–1.77)

## 2018-02-19 LAB — T4: T4 TOTAL: 6.7 ug/dL (ref 4.5–12.0)

## 2018-02-19 MED ORDER — FAMOTIDINE 20 MG PO TABS
20.0000 mg | ORAL_TABLET | Freq: Two times a day (BID) | ORAL | Status: DC
  Administered 2018-02-19 – 2018-02-20 (×3): 20 mg via ORAL
  Filled 2018-02-19 (×3): qty 1

## 2018-02-19 NOTE — Progress Notes (Signed)
[facility]                                                                              Curtis Gill Demographics  Curtis Gill, is a 42 y.o. male, DOB - Jan 31, 1976, WUJ:[id number]  Admit date - 02/18/2018   Admitting Physician Lorretta Harp, MD  Outpatient Primary MD for the Curtis Gill is Curtis Gill, No Pcp Per  Outpatient specialists:   LOS - 0  days   Medical records reviewed and are as summarized below:    Chief Complaint  Curtis Gill presents with  . Nausea  . Emesis  . Near Syncope       Brief summary   Curtis Gill is a 42 year old male with history of chronic back pain, herniated disc with lumbar surgeries 3 years ago, relocated from [place] presented with nausea, vomiting, diarrhea, near syncopal episode.  Curtis Gill reported that he was seen in the ED on 02/15/2018 for acute on chronic back pain flare and was given Sterapred and Robaxin.  Curtis Gill contributed his symptoms to prednisone, had similar reaction to steroids (steroid shot in the back)  3 years ago.  Had vomiting 4-5 times unable to hold anything down, diffuse abdominal discomfort prior to admission.  In ED, Curtis Gill was diaphoretic, clammy, bradycardiac, heart rate in 40s, not on beta-blockers. No evidence of heart block on EKG.  Curtis Gill was admitted for further work-up.   Assessment & Plan    Principal Problem:   Nausea vomiting and diarrhea -Unclear etiology, possibly allergic reaction to prednisone versus viral gastroenteritis At the time of my examination, symptoms have significantly improved, will place on solid diet -Continue IV fluid hydration, decrease to 125 cc an hour, Zofran as needed, -Follow blood cultures, GI pathogen panel  Active Problems: Acute on chronic back pain -Curtis Gill reports history of herniated disc and multiple lumbar surgeries, 3 years ago in [place] -Has spinal tenderness, L3-L4 region, no focal neurological deficits, no urinary or bowel incontinence.  Will obtain MRI of the  Lspine, does not have any PCP, prefers to have it inpatient -Hold prednisone and Robaxin, continue as needed Tylenol and Percocet    Tobacco abuse -Curtis Gill counseled on smoking cessation, continue nicotine patch    Bradycardia with  Near syncope -Curtis Gill persistently has heart rate in 40s, however in ED on 6/3 heart rate was normal, not on beta-blockers -No AV blocks on EKG, may have vasovagal response to his GI symptoms -Follow 2D echo, - UDS showed THC, TSH normal 0.69 -Cardiology consulted, discussed with Dr. Sharyn Lull -Troponin x1 neg  Code Status: Full CODE STATUS DVT Prophylaxis:  SCD's Family Communication: Discussed in detail with the Curtis Gill, all imaging results, lab results explained to the Curtis Gill and wife   Disposition Plan: When medically ready, possibly in a.m.  Time Spent in minutes   35 minutes  Procedures:  None  Consultants:   Cardiology  Antimicrobials:      Medications  Scheduled Meds: . diphenhydrAMINE  25 mg Oral BID  . famotidine  20 mg Oral BID  . nicotine  21 mg Transdermal Daily   Continuous Infusions: . sodium chloride 150 mL/hr at 02/18/18 2241   PRN Meds:.acetaminophen,  ondansetron, oxyCODONE-acetaminophen, zolpidem   Antibiotics   Anti-infectives (From admission, onward)   None        Subjective:   [name] was seen and examined today.  Feeling a lot better today, still has back pain however no vomiting or diarrhea.  Curtis Gill denies dizziness, chest pain, shortness of breath,  new weakness, numbess, tingling  Objective:   Vitals:   02/18/18 2300 02/19/18 0512 02/19/18 1025 02/19/18 1027  BP: 130/72 135/86 125/72 127/88  Pulse: (!) 53 (!) 49 (!) 25 (!) 49  Resp: (!) 97 14 15 17   Temp: 98.1 F (36.7 C) 98.6 F (37 C)    TempSrc: Oral Oral    SpO2: 99% 97% 100% 96%  Weight: 88.8 kg (195 lb 12.8 oz)     Height: 6\' 5"  (1.956 m)       Intake/Output Summary (Last 24 hours) at 02/19/2018 1326 Last data filed at  02/19/2018 0900 Gross per 24 hour  Intake 3237.5 ml  Output -  Net 3237.5 ml     Wt Readings from Last 3 Encounters:  02/18/18 88.8 kg (195 lb 12.8 oz)  02/15/18 90.7 kg (200 lb)  09/12/16 99.8 kg (220 lb)     Exam  General: Alert and oriented x 3, NAD  Eyes:  HEENT:  Atraumatic, normocephalic,  Cardiovascular: S1 S2 auscultated, sinus bradycardia Regular rate and rhythm.  Respiratory: Clear to auscultation bilaterally, no wheezing, rales or rhonchi  Gastrointestinal: Soft, nontender, nondistended, + bowel sounds  Ext: no pedal edema bilaterally  Neuro: AAOx3, Cr N's II- XII. Strength 5/5 upper and lower extremities bilaterally  Musculoskeletal: No digital cyanosis, clubbing  Skin: No rashes  Psych: Normal affect and demeanor, alert and oriented x3    Data Reviewed:  I have personally reviewed following labs and imaging studies  Micro Results No results found for this or any previous visit (from the past 240 hour(s)).  Radiology Reports Dg Chest Port 1 View  Result Date: 02/18/2018 CLINICAL DATA:  Bradycardia.  Weakness and dizziness. EXAM: PORTABLE CHEST 1 VIEW COMPARISON:  None. FINDINGS: The heart size and mediastinal contours are within normal limits. Both lungs are clear. The visualized skeletal structures are unremarkable. IMPRESSION: No active disease. Electronically Signed   By: Obie DredgeWilliam T Derry M.D.   On: 02/18/2018 16:00    Lab Data:  CBC: Recent Labs  Lab 02/18/18 1116  WBC 12.4*  HGB 14.7  HCT 43.7  MCV 88.6  PLT 365   Basic Metabolic Panel: Recent Labs  Lab 02/18/18 1116  NA 141  K 4.2  CL 105  CO2 27  GLUCOSE 97  BUN 10  CREATININE 0.90  CALCIUM 10.0   GFR: Estimated Creatinine Clearance: 135.7 mL/min (by C-G formula based on SCr of 0.9 mg/dL). Liver Function Tests: Recent Labs  Lab 02/18/18 1608  AST 21  ALT 22  ALKPHOS 46  BILITOT 0.5  PROT 7.4  ALBUMIN 4.2   Recent Labs  Lab 02/18/18 1608  LIPASE 25   No  results for input(s): AMMONIA in the last 168 hours. Coagulation Profile: No results for input(s): INR, PROTIME in the last 168 hours. Cardiac Enzymes: No results for input(s): CKTOTAL, CKMB, CKMBINDEX, TROPONINI in the last 168 hours. BNP (last 3 results) No results for input(s): PROBNP in the last 8760 hours. HbA1C: No results for input(s): HGBA1C in the last 72 hours. CBG: Recent Labs  Lab 02/18/18 1558  GLUCAP 119*   Lipid Profile: No results for input(s): CHOL, HDL, LDLCALC,  TRIG, CHOLHDL, LDLDIRECT in the last 72 hours. Thyroid Function Tests: Recent Labs    02/18/18 1754 02/19/18 0235  TSH 0.691  --   T4TOTAL 6.7  --   FREET4  --  0.96   Anemia Panel: No results for input(s): VITAMINB12, FOLATE, FERRITIN, TIBC, IRON, RETICCTPCT in the last 72 hours. Urine analysis:    Component Value Date/Time   COLORURINE YELLOW 02/18/2018 1744   APPEARANCEUR CLOUDY (A) 02/18/2018 1744   LABSPEC 1.026 02/18/2018 1744   PHURINE 6.0 02/18/2018 1744   GLUCOSEU NEGATIVE 02/18/2018 1744   HGBUR SMALL (A) 02/18/2018 1744   BILIRUBINUR NEGATIVE 02/18/2018 1744   KETONESUR NEGATIVE 02/18/2018 1744   PROTEINUR 30 (A) 02/18/2018 1744   NITRITE NEGATIVE 02/18/2018 1744   LEUKOCYTESUR NEGATIVE 02/18/2018 1744     Kolbee Stallman M.D. [facility] 02/19/2018, 1:26 PM  Pager: [phone] Between 7am to 7pm - call Pager - [phone]  After 7pm go to [email or web address] - password [email or web address]  Call night coverage person covering after 7pm

## 2018-02-19 NOTE — Consult Note (Signed)
Reason for Consult:Marked sinus bradycardia Referring Physician:[facility], hospitalist  Curtis Gill is an 42 y.o. male.  OEU:MPNTIRW is 42 year old male with past medical history significant for hypertension, not on any pain medication.  History of back surgery in the past, chronic back pain, recently moved from [place], was seen in the ED a few days ago because of chronic back pain and was prescribed prednisone and Robaxin, and since then started having abdominal pain associated with nausea and vomiting.  Cardiologic consultation is called as patient was noted to have bradycardia during these episodes.  Patient denies any dizziness, lightheadedness or syncope.  States has been very active prior to this back issues.  Denies any exertional chest pain.  Denies any history of syncope.  Denies shortness of breath, palpitations.  Patient not on any AV blocking medications.  Patient has remained in sinus bradycardia, heart rate in low 40s to 50s.  Patient asymptomatic.  Past Medical History:  Diagnosis Date  . Chronic back pain   . Tobacco abuse     Past Surgical History:  Procedure Laterality Date  . BACK SURGERY      Family History  Problem Relation Age of Onset  . Hyperlipidemia Mother     Social History:  reports that he has been smoking cigarettes.  He has been smoking about 0.50 packs per day. He has never used smokeless tobacco. He reports that he drinks alcohol. He reports that he has current or past drug history.  Allergies:  Allergies  Allergen Reactions  . Prednisone     Nausea, vomiting, diarrhea   . Ibuprofen Nausea Only    Stomach upset     Medications: I have reviewed the patient's current medications.  Results for orders placed or performed during the hospital encounter of 02/18/18 (from the past 48 hour(s))  Basic metabolic panel     Status: None   Collection Time: 02/18/18 11:16 AM  Result Value Ref Range   Sodium 141 135 - 145 mmol/L   Potassium 4.2 3.5 -  5.1 mmol/L   Chloride 105 101 - 111 mmol/L   CO2 27 22 - 32 mmol/L   Glucose, Bld 97 65 - 99 mg/dL   BUN 10 6 - 20 mg/dL   Creatinine, Ser 0.90 0.61 - 1.24 mg/dL   Calcium 10.0 8.9 - 10.3 mg/dL   GFR calc non Af Amer >60 >60 mL/min   GFR calc Af Amer >60 >60 mL/min    Comment: (NOTE) The eGFR has been calculated using the CKD EPI equation. This calculation has not been validated in all clinical situations. eGFR's persistently <60 mL/min signify possible Chronic Kidney Disease.    Anion gap 9 5 - 15    Comment: Performed at [facility] [address] [place], [place] [address]  CBC     Status: Abnormal   Collection Time: 02/18/18 11:16 AM  Result Value Ref Range   WBC 12.4 (H) 4.0 - 10.5 K/uL   RBC 4.93 4.22 - 5.81 MIL/uL   Hemoglobin 14.7 13.0 - 17.0 g/dL   HCT 43.7 39.0 - 52.0 %   MCV 88.6 78.0 - 100.0 fL   MCH 29.8 26.0 - 34.0 pg   MCHC 33.6 30.0 - 36.0 g/dL   RDW 13.6 11.5 - 15.5 %   Platelets 365 150 - 400 K/uL    Comment: Performed at [facility], [place] [address], [place], [place] [address]  CBG monitoring, ED     Status: Abnormal  Collection Time: 02/18/18  3:58 PM  Result Value Ref Range   Glucose-Capillary 119 (H) 65 - 99 mg/dL   Comment 1 Notify RN   Lipase, blood     Status: None   Collection Time: 02/18/18  4:08 PM  Result Value Ref Range   Lipase 25 11 - 51 U/L    Comment: Performed at [facility] [address] [place], [place] [address]  Hepatic function panel     Status: Abnormal   Collection Time: 02/18/18  4:08 PM  Result Value Ref Range   Total Protein 7.4 6.5 - 8.1 g/dL   Albumin 4.2 3.5 - 5.0 g/dL   AST 21 15 - 41 U/L   ALT 22 17 - 63 U/L   Alkaline Phosphatase 46 38 - 126 U/L   Total Bilirubin 0.5 0.3 - 1.2 mg/dL   Bilirubin, Direct <0.1 (L) 0.1 - 0.5 mg/dL   Indirect Bilirubin NOT CALCULATED 0.3 - 0.9 mg/dL    Comment: Performed at [facility] [address], [place], [place] [address]  I-stat troponin, ED      Status: None   Collection Time: 02/18/18  4:18 PM  Result Value Ref Range   Troponin i, poc 0.00 0.00 - 0.08 ng/mL   Comment 3            Comment: Due to the release kinetics of cTnI, a negative result within the first hours of the onset of symptoms does not rule out myocardial infarction with certainty. If myocardial infarction is still suspected, repeat the test at appropriate intervals.   Urinalysis, Routine w reflex microscopic     Status: Abnormal   Collection Time: 02/18/18  5:44 PM  Result Value Ref Range   Color, Urine YELLOW YELLOW   APPearance CLOUDY (A) CLEAR   Specific Gravity, Urine 1.026 1.005 - 1.030   pH 6.0 5.0 - 8.0   Glucose, UA NEGATIVE NEGATIVE mg/dL   Hgb urine dipstick SMALL (A) NEGATIVE   Bilirubin Urine NEGATIVE NEGATIVE   Ketones, ur NEGATIVE NEGATIVE mg/dL   Protein, ur 30 (A) NEGATIVE mg/dL   Nitrite NEGATIVE NEGATIVE   Leukocytes, UA NEGATIVE NEGATIVE   RBC / HPF 0-5 0 - 5 RBC/hpf   WBC, UA 11-20 0 - 5 WBC/hpf   Bacteria, UA RARE (A) NONE SEEN   Mucus PRESENT    Amorphous Crystal PRESENT     Comment: Performed at [facility], [address] [place], [place] [address]  TSH     Status: None   Collection Time: 02/18/18  5:54 PM  Result Value Ref Range   TSH 0.691 0.350 - 4.500 uIU/mL    Comment: Performed by a 3rd Generation assay with a functional sensitivity of <=0.01 uIU/mL. Performed at [facility], [place] [address], [place], [place] [address]   T4     Status: None   Collection Time: 02/18/18  5:54 PM  Result Value Ref Range   T4, Total 6.7 4.5 - 12.0 ug/dL    Comment: (NOTE) Performed At: [facility] [address] [place], [place] [id number] Rush Farmer MD RJ:[id number] Performed at [facility], [place] [address], [place], [place] [address]   Rapid urine drug screen (hospital performed)     Status: Abnormal   Collection Time: 02/19/18  2:22 AM  Result Value Ref Range   Opiates NONE DETECTED NONE DETECTED    Cocaine NONE DETECTED NONE DETECTED   Benzodiazepines NONE DETECTED  NONE DETECTED   Amphetamines NONE DETECTED NONE DETECTED   Tetrahydrocannabinol POSITIVE (A) NONE DETECTED   Barbiturates NONE DETECTED NONE DETECTED    Comment: (NOTE) DRUG SCREEN FOR MEDICAL PURPOSES ONLY.  IF CONFIRMATION IS NEEDED FOR ANY PURPOSE, NOTIFY LAB WITHIN 5 DAYS. LOWEST DETECTABLE LIMITS FOR URINE DRUG SCREEN Drug Class                     Cutoff (ng/mL) Amphetamine and metabolites    1000 Barbiturate and metabolites    200 Benzodiazepine                 008 Tricyclics and metabolites     300 Opiates and metabolites        300 Cocaine and metabolites        300 THC                            50 Performed at [facility], [place] [address] [place], [place] [address]   T4, free     Status: None   Collection Time: 02/19/18  2:35 AM  Result Value Ref Range   Free T4 0.96 0.82 - 1.77 ng/dL    Comment: (NOTE) Biotin ingestion may interfere with free T4 tests. If the results are inconsistent with the TSH level, previous test results, or the clinical presentation, then consider biotin interference. If needed, order repeat testing after stopping biotin. Performed at [facility], [place] [address] [place], [place] [address]     Dg Chest Port 1 View  Result Date: 02/18/2018 CLINICAL DATA:  Bradycardia.  Weakness and dizziness. EXAM: PORTABLE CHEST 1 VIEW COMPARISON:  None. FINDINGS: The heart size and mediastinal contours are within normal limits. Both lungs are clear. The visualized skeletal structures are unremarkable. IMPRESSION: No active disease. Electronically Signed   By: Titus Dubin M.D.   On: 02/18/2018 16:00    Review of Systems  Constitutional: Negative for diaphoresis and malaise/fatigue.  Eyes: Negative for blurred vision.  Respiratory: Negative for cough.   Cardiovascular: Negative for chest pain and palpitations.  Gastrointestinal: Positive for abdominal pain, nausea and  vomiting.  Neurological: Negative for dizziness.   Blood pressure 137/87, pulse (!) 45, temperature 98.6 F (37 C), temperature source Oral, resp. rate 18, height '6\' 5"'  (1.956 m), weight 88.8 kg (195 lb 12.8 oz), SpO2 96 %. Physical Exam  HENT:  Head: Normocephalic and atraumatic.  Eyes: Pupils are equal, round, and reactive to light. Conjunctivae are normal. Left eye exhibits no discharge. No scleral icterus.  Neck: Normal range of motion. Neck supple. No JVD present. No tracheal deviation present. No thyromegaly present.  Cardiovascular:  Bradycardic, S1, S2 normal.  No S3 or S4 gallop.  No murmur  Respiratory: Effort normal and breath sounds normal. No respiratory distress. He has no wheezes. He has no rales.  GI: Soft. Bowel sounds are normal. He exhibits no distension. There is no tenderness. There is no rebound.  Musculoskeletal: He exhibits no edema, tenderness or deformity.    Assessment/Plan: Status post abdominal pain, nausea, vomiting probable gastritis. Marked sinus bradycardia secondary to increased vagal tone secondary to above improved, asymptomatic  hypertension. Chronic back pain. Plan Continue present management. Monitor next 24 hours on telemetry. No need for any further intervention from cardiac point of view  Charolette Forward 02/19/2018, 5:19 PM

## 2018-02-19 NOTE — Care Management Note (Signed)
Case Management Note Donn PieriniKristi Bosten Newstrom RN, BSN Unit 4E-Case Manager [phone]  Patient Details  Name: Curtis BeneJonathan Hartje MRN: [id number] Date of Birth: 04/18/1976  Subjective/Objective:  Pt presented with N/V with back pain                  Action/Plan: PTA pt lived at home independent- recently relocated here from [place]. - spoke with pt at bedside per pt he [place] out of state med[place] in [place]. However he has not been able to transfer his medicaid to [place] yet- he has been working on this but currently does not have insurance here in [place]. Pt reports that he has not been on any medications and the last prescription he filled here cost $4. Explained to pt that CM could not assist with any narcotic prescriptions that may be given at discharge- pt voices understanding. Pt has not established care with PCP here yet- [facility] - pt given list on clinics that see uninsured patients and also a list for Medicaid providers for when he gets his medicaid in [place]. [facility]  Monday AM to ask for f/u appointment.   Expected Discharge Date:                  Expected Discharge Plan:  Home/Self Care  In-House Referral:  NA  Discharge planning Services  CM Consult, Indigent Health Clinic  Post Acute Care Choice:    Choice offered to:     DME Arranged:    DME Agency:     HH Arranged:    HH Agency:     Status of Service:  Completed, signed off  If discussed at [employer] Length of Stay Meetings, dates discussed:    Additional Comments:  Darrold SpanWebster, Kiefer Opheim Hall, RN 02/19/2018, 4:15 PM

## 2018-02-19 NOTE — Plan of Care (Signed)
  Problem: Education: Goal: Knowledge of General Education information will improve Outcome: Progressing   Problem: Pain Managment: Goal: General experience of comfort will improve Outcome: Progressing   Problem: Safety: Goal: Ability to remain free from injury will improve Outcome: Progressing   Problem: Skin Integrity: Goal: Risk for impaired skin integrity will decrease Outcome: Progressing   

## 2018-02-19 NOTE — Progress Notes (Signed)
  Echocardiogram 2D Echocardiogram has been performed.  Delcie RochENNINGTON, Maddock Finigan 02/19/2018, 1:47 PM

## 2018-02-20 ENCOUNTER — Encounter (HOSPITAL_COMMUNITY): Payer: Self-pay | Admitting: Radiology

## 2018-02-20 ENCOUNTER — Observation Stay (HOSPITAL_COMMUNITY): Payer: Medicaid Other

## 2018-02-20 DIAGNOSIS — M5440 Lumbago with sciatica, unspecified side: Secondary | ICD-10-CM | POA: Diagnosis not present

## 2018-02-20 DIAGNOSIS — R55 Syncope and collapse: Secondary | ICD-10-CM | POA: Diagnosis not present

## 2018-02-20 DIAGNOSIS — R001 Bradycardia, unspecified: Secondary | ICD-10-CM | POA: Diagnosis not present

## 2018-02-20 DIAGNOSIS — R112 Nausea with vomiting, unspecified: Secondary | ICD-10-CM | POA: Diagnosis not present

## 2018-02-20 LAB — BASIC METABOLIC PANEL
ANION GAP: 9 (ref 5–15)
BUN: 7 mg/dL (ref 6–20)
CALCIUM: 8.5 mg/dL — AB (ref 8.9–10.3)
CO2: 24 mmol/L (ref 22–32)
CREATININE: 0.91 mg/dL (ref 0.61–1.24)
Chloride: 104 mmol/L (ref 101–111)
Glucose, Bld: 98 mg/dL (ref 65–99)
Potassium: 3.3 mmol/L — ABNORMAL LOW (ref 3.5–5.1)
SODIUM: 137 mmol/L (ref 135–145)

## 2018-02-20 LAB — CBC
HCT: 38.1 % — ABNORMAL LOW (ref 39.0–52.0)
Hemoglobin: 12.5 g/dL — ABNORMAL LOW (ref 13.0–17.0)
MCH: 29.9 pg (ref 26.0–34.0)
MCHC: 32.8 g/dL (ref 30.0–36.0)
MCV: 91.1 fL (ref 78.0–100.0)
PLATELETS: 285 10*3/uL (ref 150–400)
RBC: 4.18 MIL/uL — AB (ref 4.22–5.81)
RDW: 13.4 % (ref 11.5–15.5)
WBC: 6.2 10*3/uL (ref 4.0–10.5)

## 2018-02-20 MED ORDER — CYCLOBENZAPRINE HCL 10 MG PO TABS
5.0000 mg | ORAL_TABLET | Freq: Two times a day (BID) | ORAL | Status: DC | PRN
Start: 2018-02-20 — End: 2018-02-20

## 2018-02-20 MED ORDER — ONDANSETRON 4 MG PO TBDP: 4.0000 mg | ORAL_TABLET | Freq: Three times a day (TID) | ORAL | 0 refills | Status: DC | PRN

## 2018-02-20 MED ORDER — CYCLOBENZAPRINE HCL 5 MG PO TABS: 5.0000 mg | ORAL_TABLET | Freq: Two times a day (BID) | ORAL | 1 refills | Status: DC | PRN

## 2018-02-20 MED ORDER — TRAMADOL HCL 50 MG PO TABS: 50.0000 mg | ORAL_TABLET | Freq: Four times a day (QID) | ORAL | 0 refills | Status: AC | PRN

## 2018-02-20 MED ORDER — POTASSIUM CHLORIDE CRYS ER 20 MEQ PO TBCR
40.0000 meq | EXTENDED_RELEASE_TABLET | Freq: Once | ORAL | Status: AC
  Administered 2018-02-20: 40 meq via ORAL
  Filled 2018-02-20: qty 2

## 2018-02-20 MED ORDER — OMEPRAZOLE 20 MG PO CPDR: 20.0000 mg | DELAYED_RELEASE_CAPSULE | Freq: Every day | ORAL | 1 refills | Status: DC

## 2018-02-20 NOTE — Progress Notes (Signed)
.  Subjective:  Denies any chest pain or shortness of breath denies further abdominal pain nausea vomiting. Complains of lower back pain.  Objective:  Vital Signs in the last 24 hours: Temp:  [98 F (36.7 C)-98.6 F (37 C)] 98 F (36.7 C) (06/07 1940) Pulse Rate:  [25-49] 45 (06/07 1940) Resp:  [15-22] 22 (06/07 1940) BP: (125-151)/(72-90) 151/90 (06/07 1940) SpO2:  [96 %-100 %] 99 % (06/07 1940)  Intake/Output from previous day: 06/07 0701 - 06/08 0700 In: 240 [P.O.:240] Out: -  Intake/Output from this shift: No intake/output data recorded.  Physical Exam: Neck: no adenopathy, no carotid bruit, no JVD and supple, symmetrical, trachea midline Lungs: clear to auscultation bilaterally Heart: Bradycardic S1-S2 normal no S3 gallop Abdomen: soft, non-tender; bowel sounds normal; no masses,  no organomegaly Extremities: extremities normal, atraumatic, no cyanosis or edema  Lab Results: Recent Labs    02/18/18 1116 02/20/18 0611  WBC 12.4* 6.2  HGB 14.7 12.5*  PLT 365 285   Recent Labs    02/18/18 1116 02/20/18 0611  NA 141 137  K 4.2 3.3*  CL 105 104  CO2 27 24  GLUCOSE 97 98  BUN 10 7  CREATININE 0.90 0.91   No results for input(s): TROPONINI in the last 72 hours.  Invalid input(s): CK, MB Hepatic Function Panel Recent Labs    02/18/18 1608  PROT 7.4  ALBUMIN 4.2  AST 21  ALT 22  ALKPHOS 46  BILITOT 0.5  BILIDIR <0.1*  IBILI NOT CALCULATED   No results for input(s): CHOL in the last 72 hours. No results for input(s): PROTIME in the last 72 hours.  Imaging: Imaging results have been reviewed and Dg Chest Port 1 View  Result Date: 02/18/2018 CLINICAL DATA:  Bradycardia.  Weakness and dizziness. EXAM: PORTABLE CHEST 1 VIEW COMPARISON:  None. FINDINGS: The heart size and mediastinal contours are within normal limits. Both lungs are clear. The visualized skeletal structures are unremarkable. IMPRESSION: No active disease. Electronically Signed   By: Obie DredgeWilliam T  Derry M.D.   On: 02/18/2018 16:00    Cardiac Studies:  Assessment/Plan:  Status post abdominal pain, nausea, vomiting probable gastritis. Marked sinus bradycardia secondary to increased vagal tone secondary to above improved, asymptomatic Hypertension. Chronic back pain. Plan Continue present management Okay to discharge from cardiac point of view Follow-up with me in 2 weeks/when necessary   LOS: 0 days    Rinaldo CloudHarwani, Isatu Macinnes 02/20/2018, 9:32 AM

## 2018-02-20 NOTE — Progress Notes (Signed)
Pt discharged with family. IV and telemetry box removed. Pt received discharge instructions and all questions were answered. Pt left with all of his belongings. Pt ambulated off the unit and was accompanied by family.    Ardeen JourdainLauren Odessa Nishi BSN, RN

## 2018-02-20 NOTE — Discharge Summary (Signed)
Physician Discharge Summary   Patient ID: Curtis Gill MRN: [id number] DOB/AGE: 41/03/1976 41 y.o.  Admit date: 02/18/2018 Discharge date: 02/20/2018  Primary Care Physician:  Patient, No Pcp Per   Recommendations for Outpatient Follow-up:  1. Follow up with PCP in 1-2 weeks  Home Health: None  Equipment/Devices: None  Discharge Condition: stable  CODE STATUS: FULL  Diet recommendation: Regular   Discharge Diagnoses:    . Chronic back pain . Tobacco abuse . Nausea vomiting and diarrhea . Bradycardia likely vasovagal . Near syncope likely vasovagal   Consults: Cardiology    Allergies:   Allergies  Allergen Reactions  . Prednisone     Nausea, vomiting, diarrhea   . Ibuprofen Nausea Only    Stomach upset      DISCHARGE MEDICATIONS: Allergies as of 02/20/2018      Reactions   Prednisone    Nausea, vomiting, diarrhea   Ibuprofen Nausea Only   Stomach upset      Medication List    STOP taking these medications   methocarbamol 500 MG tablet Commonly known as:  ROBAXIN   penicillin v potassium 500 MG tablet Commonly known as:  VEETID   predniSONE 10 MG (21) Tbpk tablet Commonly known as:  STERAPRED UNI-PAK 21 TAB     TAKE these medications   cyclobenzaprine 5 MG tablet Commonly known as:  FLEXERIL Take 1 tablet (5 mg total) by mouth 2 (two) times daily as needed for muscle spasms.   omeprazole 20 MG capsule Commonly known as:  PRILOSEC Take 1 capsule (20 mg total) by mouth daily.   ondansetron 4 MG disintegrating tablet Commonly known as:  ZOFRAN ODT Take 1 tablet (4 mg total) by mouth every 8 (eight) hours as needed for nausea or vomiting.   traMADol 50 MG tablet Commonly known as:  ULTRAM Take 1 tablet (50 mg total) by mouth every 6 (six) hours as needed for up to 5 days for moderate pain or severe pain.        Brief H and P: For complete details please refer to admission H and P, but in brief Patient is a 42 year old male with history  of chronic back pain, herniated disc with lumbar surgeries 3 years ago, [place] presented with nausea, vomiting, diarrhea, near syncopal episode.  Patient reported that he was seen in the ED on 02/15/2018 for acute on chronic back pain flare and was given Sterapred and Robaxin.  Patient contributed his symptoms to prednisone, had similar reaction to steroids (steroid shot in the back)  3 years ago.  Had vomiting 4-5 times unable to hold anything down, diffuse abdominal discomfort prior to admission.  In ED, patient was diaphoretic, clammy, bradycardiac, heart rate in 40s, not on beta-blockers. No evidence of heart block on EKG.  Patient was admitted for further work-up.   Hospital Course:  Nausea vomiting and diarrhea -Unclear etiology, possibly allergic reaction to prednisone versus viral gastroenteritis -Resolved, patient tolerating solid diet without any difficulty, diarrhea improved.  Acute on chronic back pain -Patient reports history of herniated disc and multiple lumbar surgeries, 3 years ago in [place] - no focal neurological deficits, no urinary or bowel incontinence.   -Hold prednisone and Robaxin due to side effects -MRI of the lumbar spine showed congenitally short pedicles, postsurgical changes in the L4-L5, mild disc bulging L5-S1, central indeterminate marrow lesion at L4 possibly an notochordal remnant, recommended follow-up MRI in 3 to 6 months -Given prescription for tramadol and Flexeril, recommended follow-up with  PCP and pain clinic     Tobacco abuse -Patient counseled on smoking cessation, continue nicotine patch    Bradycardia with  Near syncope likely vasovagal -Patient persistently has heart rate in 40s, however in ED on 6/3 heart rate was normal, not on beta-blockers -No AV blocks on EKG, may have vasovagal response to his GI symptoms - UDS showed THC, TSH normal 0.69 -2D echo showed EF of 55 to 60%, cardiology was consulted -Per cardiology,  marked sinus bradycardia secondary to increased vagal tone from nausea vomiting and diarrhea.  Recommended no further work-up, outpatient follow-up   Day of Discharge S: No complaints, tolerating solid diet, no nausea vomiting or diarrhea  BP (!) 151/90 (BP Location: Right Arm)   Pulse (!) 45   Temp 98 F (36.7 C) (Oral)   Resp (!) 22   Ht 6\' 5"  (1.956 m)   Wt 88.8 kg (195 lb 12.8 oz)   SpO2 99%   BMI 23.22 kg/m   Physical Exam: General: Alert and awake oriented x3 not in any acute distress. HEENT: anicteric sclera, pupils reactive to light and accommodation CVS: S1-S2 clear no murmur rubs or gallops Chest: clear to auscultation bilaterally, no wheezing rales or rhonchi Abdomen: soft nontender, nondistended, normal bowel sounds Extremities: no cyanosis, clubbing or edema noted bilaterally Neuro: Cranial nerves II-XII intact, no focal neurological deficits   The results of significant diagnostics from this hospitalization (including imaging, microbiology, ancillary and laboratory) are listed below for reference.      Procedures/Studies:  Mr Lumbar Spine Wo Contrast  Result Date: 02/20/2018 CLINICAL DATA:  Chronic back pain. History of herniated disc with lumbar surgeries 3 years ago. EXAM: MRI LUMBAR SPINE WITHOUT CONTRAST TECHNIQUE: Multiplanar, multisequence MR imaging of the lumbar spine was performed. No intravenous contrast was administered. COMPARISON:  None. FINDINGS: Segmentation: Conventional anatomy assumed, with the last open disc space designated L5-S1. Alignment:  Normal. Vertebrae: The lumbar pedicles are diffusely short on a congenital basis. 2.1 x 1.6 cm lesion centrally in the L4 vertebral body demonstrates nonspecific low T1 and high T2 signal. Based on location, this may reflect a notochordal remnant. There are mild endplate degenerative changes at L5-S1 which are asymmetric to the right. No evidence of acute fracture or pars defect. The visualized sacroiliac  joints appear unremarkable. Conus medullaris: Extends to the L1-2 level and appears normal. Paraspinal and other soft tissues: No significant paraspinal findings. Disc levels: From T11-12 through L2-3, the discs are well hydrated with maintained height. There is no spinal stenosis or nerve root encroachment. L3-4: Disc height and hydration are maintained. Mild bilateral facet hypertrophy. No spinal stenosis or nerve root encroachment. L4-5: Previous right laminectomy. The disc is desiccated with loss of height and annular disc bulging. There is a broad-based protrusion within the right subarticular zone and foramen, exerting mass effect on the thecal sac and causing possible right L5 nerve root encroachment. The right foramen appears adequately patent. There is mild bilateral facet hypertrophy. L5-S1: The disc is degenerated with loss of height and annular bulging. There is mild facet and ligamentous hypertrophy. There is mild asymmetric narrowing of the right lateral recess without S1 nerve root displacement. There is also mild inferior right foraminal narrowing. IMPRESSION: 1. Congenitally short pedicles. 2. Postsurgical changes on the right at L4-5 with recurrent/residual broad-based disc protrusion in the right subarticular zone and foramen causing possible right L5 nerve root encroachment in the canal. 3. Mild disc bulging at L5-S1 with resulting mild narrowing of the  right lateral recess and right foramen, but no definite nerve root encroachment. 4. 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 Direct comparison with prior imaging would be helpful to ascertain stability. In the absence of prior imaging, consider follow-up MRI with and without contrast in 3-6 months. Electronically Signed   By: Carey Bullocks M.D.   On: 02/20/2018 12:15   Dg Chest Port 1 View  Result Date: 02/18/2018 CLINICAL DATA:  Bradycardia.  Weakness and dizziness. EXAM: PORTABLE CHEST 1 VIEW COMPARISON:   None. FINDINGS: The heart size and mediastinal contours are within normal limits. Both lungs are clear. The visualized skeletal structures are unremarkable. IMPRESSION: No active disease. Electronically Signed   By: Obie Dredge M.D.   On: 02/18/2018 16:00      LAB RESULTS: Basic Metabolic Panel: Recent Labs  Lab 02/18/18 1116 02/20/18 0611  NA 141 137  K 4.2 3.3*  CL 105 104  CO2 27 24  GLUCOSE 97 98  BUN 10 7  CREATININE 0.90 0.91  CALCIUM 10.0 8.5*   Liver Function Tests: Recent Labs  Lab 02/18/18 1608  AST 21  ALT 22  ALKPHOS 46  BILITOT 0.5  PROT 7.4  ALBUMIN 4.2   Recent Labs  Lab 02/18/18 1608  LIPASE 25   No results for input(s): AMMONIA in the last 168 hours. CBC: Recent Labs  Lab 02/18/18 1116 02/20/18 0611  WBC 12.4* 6.2  HGB 14.7 12.5*  HCT 43.7 38.1*  MCV 88.6 91.1  PLT 365 285   Cardiac Enzymes: No results for input(s): CKTOTAL, CKMB, CKMBINDEX, TROPONINI in the last 168 hours. BNP: Invalid input(s): POCBNP CBG: Recent Labs  Lab 02/18/18 1558  GLUCAP 119*      Disposition and Follow-up: Discharge Instructions    Diet - low sodium heart healthy   Complete by:  As directed    Discharge instructions   Complete by:  As directed    Avoid lifting weights over 20lbs, pushing or pulling heavy weights.  You will benefit from going to pain clinic, please discuss with community wellness center for referral.   Increase activity slowly   Complete by:  As directed        DISPOSITION: Home   DISCHARGE FOLLOW-UP Follow-up Information    [facility]. Schedule an appointment as soon as possible for a visit.   Why:  Please call on Monday 02/22/18 before 12noon and ask for appointment (let them know you need f/u discharged from hospital) ask about establishing care and orange card at appointment.  Contact information: [address] [place] [address] [place] [address] [phone]       Rinaldo Cloud, MD. Schedule an appointment as soon as possible for a visit in 2 week(s).   Specialty:  Cardiology Contact information: [address] [place] [address] [phone]            Time coordinating discharge:  35-minutes  Signed:   Thad Ranger M.D. [facility] 02/20/2018, 12:30 PM Pager: [phone]

## 2018-02-24 LAB — CULTURE, BLOOD (ROUTINE X 2)
CULTURE: NO GROWTH
CULTURE: NO GROWTH
SPECIAL REQUESTS: ADEQUATE
Special Requests: ADEQUATE

## 2019-03-31 IMAGING — MR MR LUMBAR SPINE W/O CM
4 of 5 series · 18 of 48 positions shown · non-contrast
Comparison: None.

CLINICAL DATA: Chronic back pain. History of herniated disc with
lumbar surgeries 3 years ago.

EXAM:
MRI LUMBAR SPINE WITHOUT CONTRAST
TECHNIQUE: Multiplanar, multisequence MR imaging of the lumbar spine was
performed. No intravenous contrast was administered.

[Series 3: T2 · sagittal · 4.0mm · 0.55mm/px · 6 of 15 slices shown (1 of 2)]
[im 1/15]
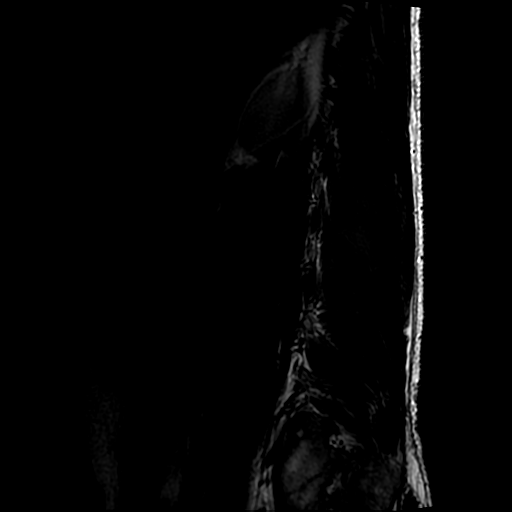
[im 3/15]
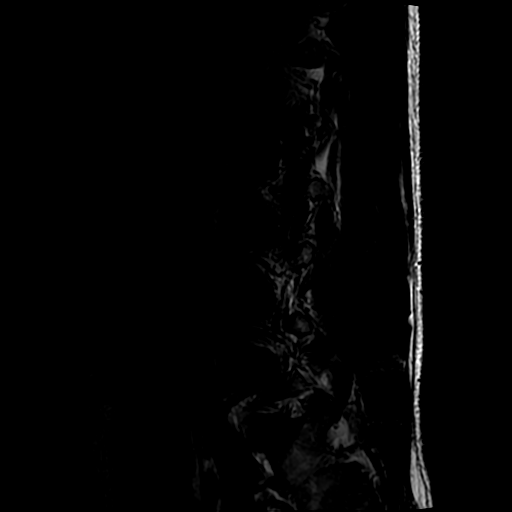
[im 6/15]
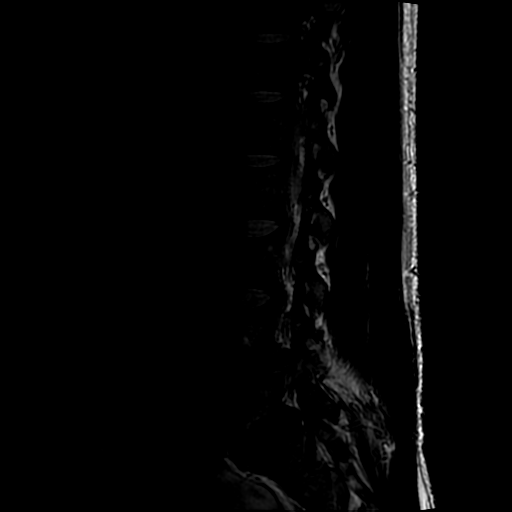
[im 9/15]
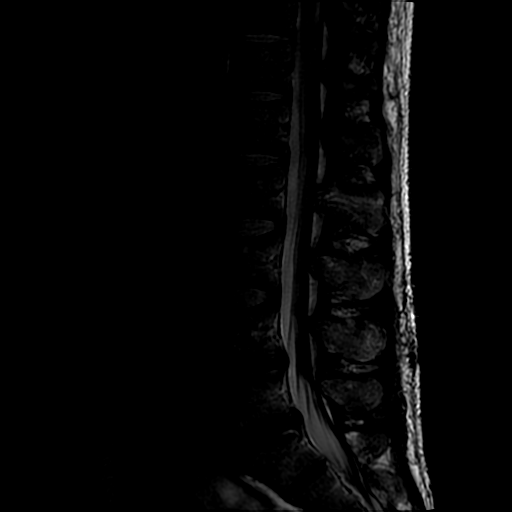
[im 12/15]
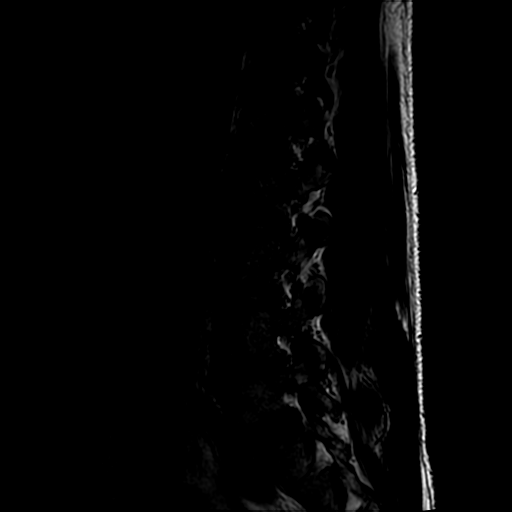
[im 15/15]
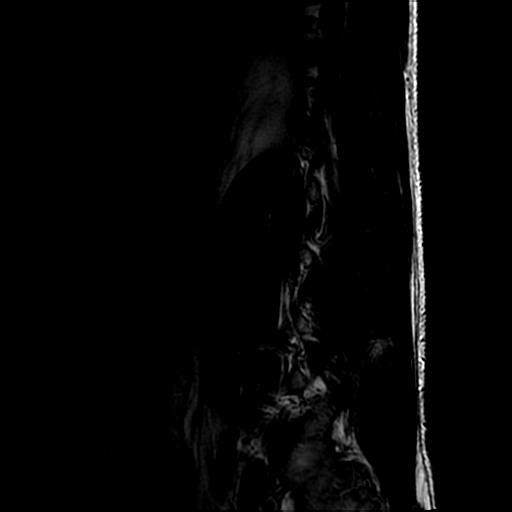

[Series 5: T1 · sagittal · 4.0mm · 0.55mm/px · 3 of 15 slices shown (1 of 2)]
[im 3/15]
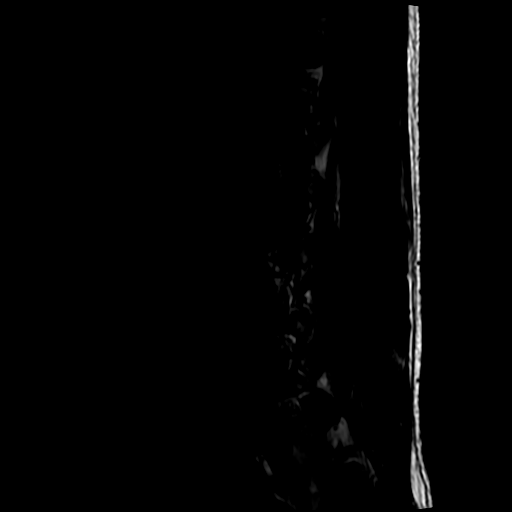
[im 9/15]
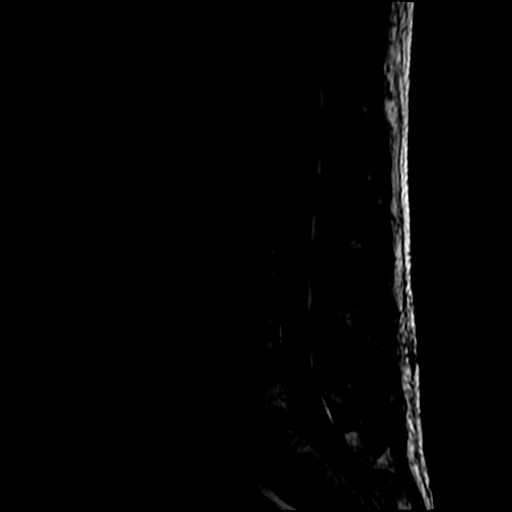
[im 15/15]
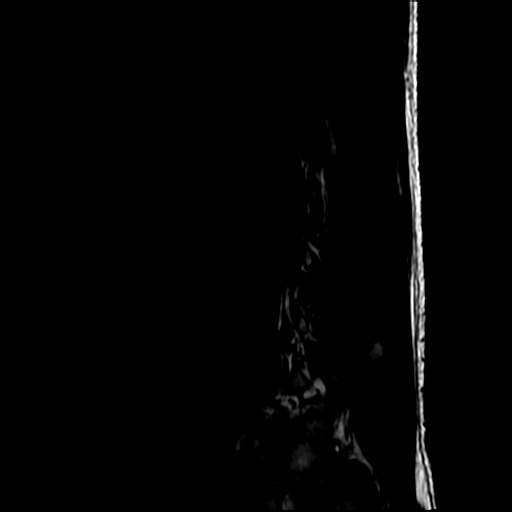

[Series 6: T2 · axial · 4.0mm · 0.39mm/px · z∈[-64,+112]mm · 6 of 39 slices shown (2 of 2)]
[im 1/39]
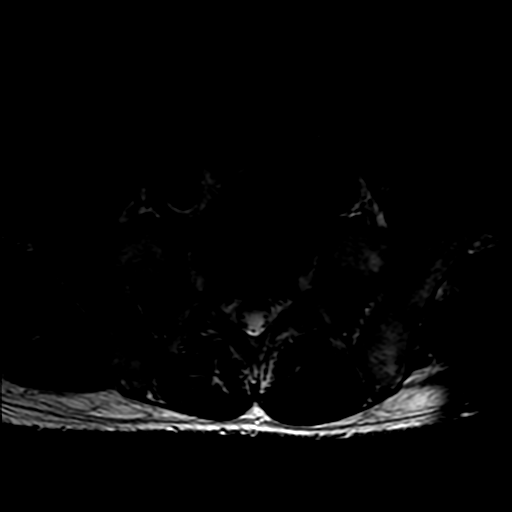
[im 6/39]
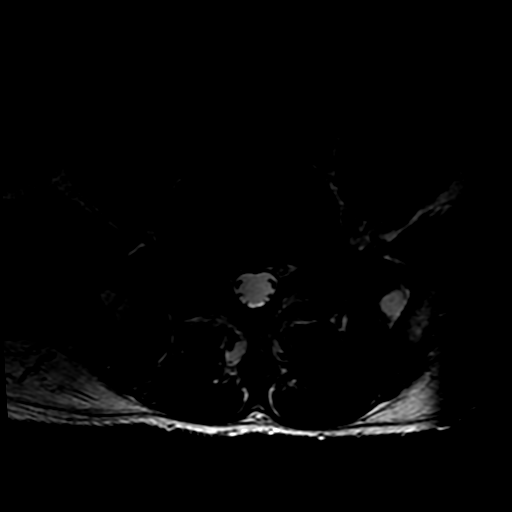
[im 11/39]
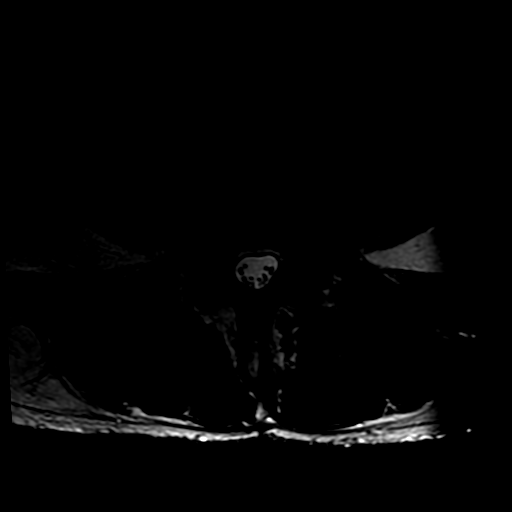
[im 17/39]
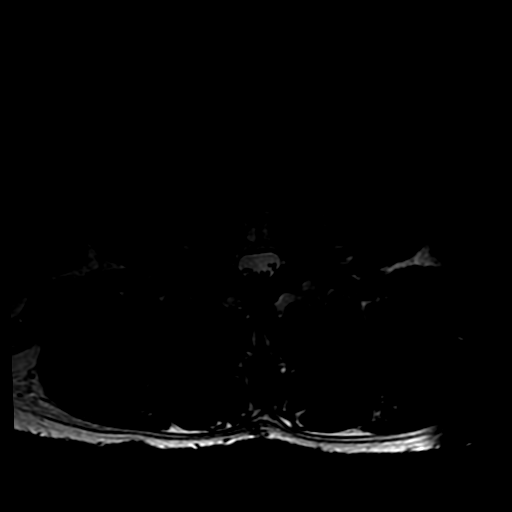
[im 20/39]
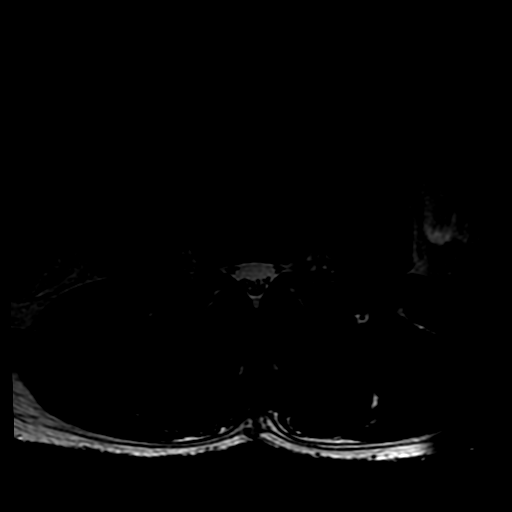
[im 33/39]
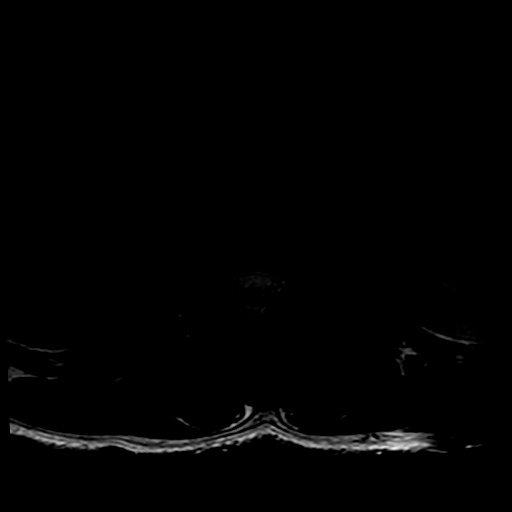

[Series 7: T1 · axial · 4.0mm · 0.39mm/px · z∈[-40,+112]mm · 3 of 39 slices shown (2 of 2)]
[im 6/39]
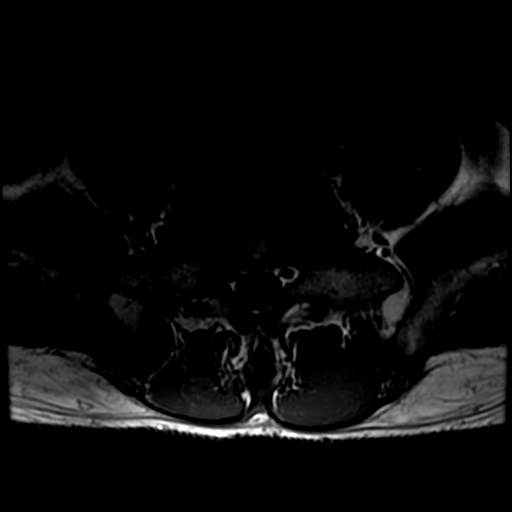
[im 20/39]
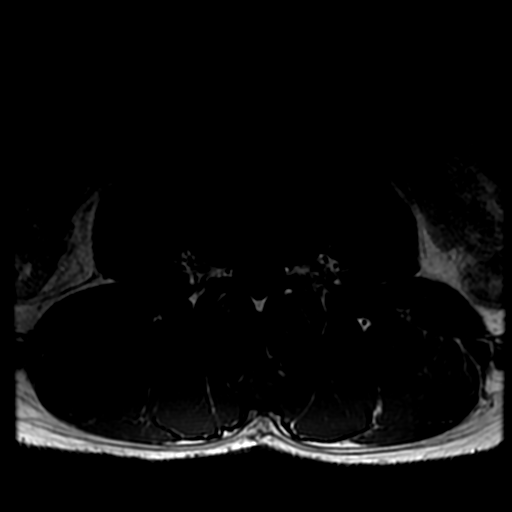
[im 33/39]
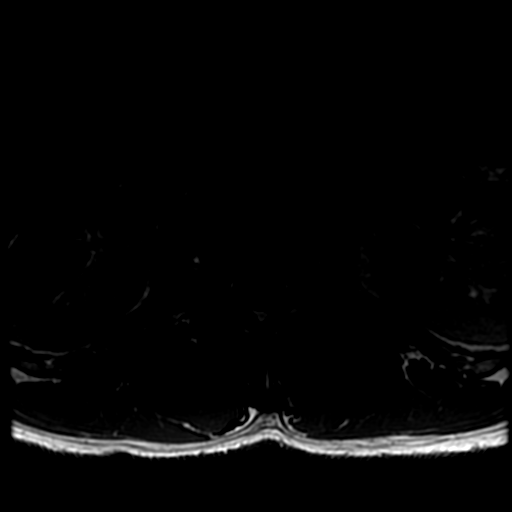

[18 of 48 positions shown; findings below may reference images not displayed]

FINDINGS: Segmentation: Conventional anatomy assumed, with the last open disc
space designated L5-S1.

Alignment:  Normal.

Vertebrae: The lumbar pedicles are diffusely short on a congenital
basis. 2.1 x 1.6 cm lesion centrally in the L4 vertebral body
demonstrates nonspecific low T1 and high T2 signal. Based on
location, this may reflect a notochordal remnant. There are mild
endplate degenerative changes at L5-S1 which are asymmetric to the
right. No evidence of acute fracture or pars defect. The visualized
sacroiliac joints appear unremarkable.

Conus medullaris: Extends to the L1-2 level and appears normal.

Paraspinal and other soft tissues: No significant paraspinal
findings.

Disc levels:

From T11-12 through L2-3, the discs are well hydrated with
maintained height. There is no spinal stenosis or nerve root
encroachment.

L3-4: Disc height and hydration are maintained. Mild bilateral facet
hypertrophy. No spinal stenosis or nerve root encroachment.

L4-5: Previous right laminectomy. The disc is desiccated with loss
of height and annular disc bulging. There is a broad-based
protrusion within the right subarticular zone and foramen, exerting
mass effect on the thecal sac and causing possible right L5 nerve
root encroachment. The right foramen appears adequately patent.
There is mild bilateral facet hypertrophy.

L5-S1: The disc is degenerated with loss of height and annular
bulging. There is mild facet and ligamentous hypertrophy. There is
mild asymmetric narrowing of the right lateral recess without S1
nerve root displacement. There is also mild inferior right foraminal
narrowing.
IMPRESSION: 1. Congenitally short pedicles.
2. Postsurgical changes on the right at L4-5 with recurrent/residual
broad-based disc protrusion in the right subarticular zone and
foramen causing possible right L5 nerve root encroachment in the
canal.
3. Mild disc bulging at L5-S1 with resulting mild narrowing of the
right lateral recess and right foramen, but no definite nerve root
encroachment.
4. Central indeterminate marrow lesion at L4, possibly a notochordal
remnant. Direct comparison with prior imaging would be helpful to
ascertain stability. In the absence of prior imaging, consider
follow-up MRI with and without contrast in 3-6 months.

## 2019-07-16 ENCOUNTER — Emergency Department (HOSPITAL_COMMUNITY)
Admission: EM | Admit: 2019-07-16 | Discharge: 2019-07-16 | Disposition: A | Discharge status: Home / Self Care | Payer: Medicaid Other | Attending: Emergency Medicine | Admitting: Emergency Medicine

## 2019-07-16 ENCOUNTER — Other Ambulatory Visit: Payer: Self-pay

## 2019-07-16 ENCOUNTER — Emergency Department (HOSPITAL_COMMUNITY): Payer: Medicaid Other

## 2019-07-16 ENCOUNTER — Encounter (HOSPITAL_COMMUNITY): Payer: Self-pay | Admitting: Emergency Medicine

## 2019-07-16 DIAGNOSIS — R109 Unspecified abdominal pain: Secondary | ICD-10-CM | POA: Diagnosis present

## 2019-07-16 DIAGNOSIS — F17210 Nicotine dependence, cigarettes, uncomplicated: Secondary | ICD-10-CM | POA: Insufficient documentation

## 2019-07-16 DIAGNOSIS — R112 Nausea with vomiting, unspecified: Secondary | ICD-10-CM | POA: Diagnosis not present

## 2019-07-16 LAB — CBC
HCT: 44 % (ref 39.0–52.0)
Hemoglobin: 14.4 g/dL (ref 13.0–17.0)
MCH: 30.5 pg (ref 26.0–34.0)
MCHC: 32.7 g/dL (ref 30.0–36.0)
MCV: 93.2 fL (ref 80.0–100.0)
Platelets: 356 10*3/uL (ref 150–400)
RBC: 4.72 MIL/uL (ref 4.22–5.81)
RDW: 13.2 % (ref 11.5–15.5)
WBC: 7.4 10*3/uL (ref 4.0–10.5)
nRBC: 0 % (ref 0.0–0.2)

## 2019-07-16 LAB — COMPREHENSIVE METABOLIC PANEL
ALT: 20 U/L (ref 0–44)
AST: 18 U/L (ref 15–41)
Albumin: 4.1 g/dL (ref 3.5–5.0)
Alkaline Phosphatase: 50 U/L (ref 38–126)
Anion gap: 7 (ref 5–15)
BUN: 6 mg/dL (ref 6–20)
CO2: 28 mmol/L (ref 22–32)
Calcium: 9.1 mg/dL (ref 8.9–10.3)
Chloride: 104 mmol/L (ref 98–111)
Creatinine, Ser: 0.98 mg/dL (ref 0.61–1.24)
GFR calc Af Amer: >60 mL/min (ref >60)
GFR calc non Af Amer: >60 mL/min (ref >60)
Glucose, Bld: 106 mg/dL — ABNORMAL HIGH (ref 70–99)
Potassium: 4.3 mmol/L (ref 3.5–5.1)
Sodium: 139 mmol/L (ref 135–145)
Total Bilirubin: 0.4 mg/dL (ref 0.3–1.2)
Total Protein: 6.7 g/dL (ref 6.5–8.1)

## 2019-07-16 LAB — URINALYSIS, ROUTINE W REFLEX MICROSCOPIC
Bilirubin Urine: NEGATIVE
Glucose, UA: NEGATIVE mg/dL
Hgb urine dipstick: NEGATIVE
Ketones, ur: NEGATIVE mg/dL
Leukocytes,Ua: NEGATIVE
Nitrite: NEGATIVE
Protein, ur: NEGATIVE mg/dL
Specific Gravity, Urine: 1.024 (ref 1.005–1.030)
pH: 6 (ref 5.0–8.0)

## 2019-07-16 LAB — LIPASE, BLOOD: Lipase: 23 U/L (ref 11–51)

## 2019-07-16 MED ORDER — IOHEXOL 300 MG/ML  SOLN
100.0000 mL | Freq: Once | INTRAMUSCULAR | Status: AC | PRN
  Administered 2019-07-16: 100 mL via INTRAVENOUS

## 2019-07-16 MED ORDER — SODIUM CHLORIDE 0.9 % IV BOLUS
1000.0000 mL | Freq: Once | INTRAVENOUS | Status: AC
  Administered 2019-07-16: 11:00:00 1000 mL via INTRAVENOUS

## 2019-07-16 MED ORDER — SODIUM CHLORIDE 0.9% FLUSH
3.0000 mL | Freq: Once | INTRAVENOUS | Status: AC
  Administered 2019-07-16: 3 mL via INTRAVENOUS

## 2019-07-16 MED ORDER — FENTANYL CITRATE (PF) 100 MCG/2ML IJ SOLN
50.0000 ug | Freq: Once | INTRAMUSCULAR | Status: AC
  Administered 2019-07-16: 50 ug via INTRAVENOUS
  Filled 2019-07-16: qty 2

## 2019-07-16 MED ORDER — ONDANSETRON 8 MG PO TBDP: 8.0000 mg | ORAL_TABLET | Freq: Three times a day (TID) | ORAL | 0 refills | Status: DC | PRN

## 2019-07-16 MED ORDER — KETOROLAC TROMETHAMINE 30 MG/ML IJ SOLN
30.0000 mg | Freq: Once | INTRAMUSCULAR | Status: AC
  Administered 2019-07-16: 30 mg via INTRAVENOUS
  Filled 2019-07-16: qty 1

## 2019-07-16 MED ORDER — PROMETHAZINE HCL 25 MG/ML IJ SOLN
25.0000 mg | Freq: Once | INTRAMUSCULAR | Status: AC
  Administered 2019-07-16: 13:00:00 25 mg via INTRAVENOUS
  Filled 2019-07-16: qty 1

## 2019-07-16 NOTE — ED Provider Notes (Signed)
[facility] EMERGENCY DEPARTMENT Provider Note   CSN: [id number] Arrival date & time: 07/16/19  6269     History   Chief Complaint Chief Complaint  Patient presents with  . Abdominal Pain    HPI Curtis Gill is a 43 y.o. male.     HPI  43 year old male presents today complaining of nausea, vomiting, abdominal pain for 3 days.  He states that he was on a Wednesday evening and was fine.  He woke up Thursday morning he had nausea, crampy abdominal pain, and vomiting.  He vomited approximately 5 times yesterday.  He feels that he was somewhat better yesterday but then the cramping and nausea returned.  He denies fever, chills, urinary symptoms.  He states his last bowel movement was on Wednesday and was normal.  He has had no previous abdominal surgery or GI work-up.  Past Medical History:  Diagnosis Date  . Chronic back pain   . Tobacco abuse     Patient Active Problem List   Diagnosis Date Noted  . Nausea vomiting and diarrhea 02/18/2018  . Bradycardia 02/18/2018  . Near syncope 02/18/2018  . Chronic back pain   . Tobacco abuse     Past Surgical History:  Procedure Laterality Date  . BACK SURGERY          Home Medications    Prior to Admission medications   Medication Sig Start Date End Date Taking? Authorizing Provider  cyclobenzaprine (FLEXERIL) 5 MG tablet Take 1 tablet (5 mg total) by mouth 2 (two) times daily as needed for muscle spasms. 02/20/18   Rai, Vernelle Emerald, MD  omeprazole (PRILOSEC) 20 MG capsule Take 1 capsule (20 mg total) by mouth daily. 02/20/18   Rai, Ripudeep K, MD  ondansetron (ZOFRAN ODT) 4 MG disintegrating tablet Take 1 tablet (4 mg total) by mouth every 8 (eight) hours as needed for nausea or vomiting. 02/20/18   Mendel Corning, MD    Family History Family History  Problem Relation Age of Onset  . Hyperlipidemia Mother     Social History Social History   Tobacco Use  . Smoking status: Current Every Day Smoker   Packs/day: 0.50    Types: Cigarettes  . Smokeless tobacco: Never Used  Substance Use Topics  . Alcohol use: Yes    Comment: occ  . Drug use: Not Currently     Allergies   Prednisone and Ibuprofen   Review of Systems Review of Systems  All other systems reviewed and are negative.    Physical Exam Updated Vital Signs BP 125/85 (BP Location: Right Arm)   Pulse 65   Temp 98.4 F (36.9 C) (Oral)   Resp 14   SpO2 96%   Physical Exam Vitals signs and nursing note reviewed.  Constitutional:      Appearance: He is well-developed.  HENT:     Head: Normocephalic and atraumatic.     Right Ear: External ear normal.     Left Ear: External ear normal.     Nose: Nose normal.  Eyes:     Conjunctiva/sclera: Conjunctivae normal.     Pupils: Pupils are equal, round, and reactive to light.  Neck:     Musculoskeletal: Normal range of motion and neck supple.  Cardiovascular:     Rate and Rhythm: Normal rate and regular rhythm.     Heart sounds: Normal heart sounds.  Pulmonary:     Effort: Pulmonary effort is normal. No respiratory distress.     Breath  sounds: Normal breath sounds. No wheezing.  Chest:     Chest wall: No tenderness.  Abdominal:     General: Bowel sounds are normal. There is no distension.     Palpations: Abdomen is soft. There is no mass.     Tenderness: There is abdominal tenderness in the right upper quadrant and right lower quadrant. There is no guarding.  Musculoskeletal: Normal range of motion.  Skin:    General: Skin is warm and dry.  Neurological:     Mental Status: He is alert and oriented to person, place, and time.     Motor: No abnormal muscle tone.     Coordination: Coordination normal.     Deep Tendon Reflexes: Reflexes are normal and symmetric.  Psychiatric:        Behavior: Behavior normal.        Thought Content: Thought content normal.        Judgment: Judgment normal.      ED Treatments / Results  Labs (all labs ordered are listed,  but only abnormal results are displayed) Labs Reviewed  CBC  LIPASE, BLOOD  COMPREHENSIVE METABOLIC PANEL  URINALYSIS, ROUTINE W REFLEX MICROSCOPIC    EKG None  Radiology No results found.  Procedures Procedures (including critical care time)  Medications Ordered in ED Medications  sodium chloride flush (NS) 0.9 % injection 3 mL (has no administration in time range)  sodium chloride 0.9 % bolus 1,000 mL (has no administration in time range)  fentaNYL (SUBLIMAZE) injection 50 mcg (has no administration in time range)     Initial Impression / Assessment and Plan / ED Course  I have reviewed the triage vital signs and the nursing notes.  Pertinent labs & imaging results that were available during my care of the patient were reviewed by me and considered in my medical decision making (see chart for details).       43 year old male presents today complaining nausea and vomiting with some diffuse abdominal pain.  He appeared to have some right side tenderness and had a CT scan.  There is no evidence of acute cholecystitis, appendicitis, or other acute intra-abdominal pathology.  He has received some IV fluids as well as Phenergan and Zofran.  He feels improved at this time.  We have discussed having a prescription for antiemetics, starting with clear liquids and gradually advance his diet.  We also discussed return precautions specifically worsening pain, fever, or inability to tolerate fluids.  He voices understanding of follow-up and return precautions  Final Clinical Impressions(s) / ED Diagnoses   Final diagnoses:  Abdominal pain, unspecified abdominal location  Non-intractable vomiting with nausea, unspecified vomiting type    ED Discharge Orders    None       Margarita Grizzle, MD 07/16/19 1328

## 2019-07-16 NOTE — ED Triage Notes (Signed)
C/o generalized abd pain, nausea, vomiting, and constipation x 2 days.  States he was seen approx 1 year ago for same symptoms.

## 2019-07-16 NOTE — ED Notes (Signed)
Patient verbalizes understanding of discharge instructions. Opportunity for questioning and answers were provided. Armband removed by staff, pt discharged from ED.  

## 2019-07-16 NOTE — ED Notes (Signed)
Patient transported to CT 

## 2021-01-15 ENCOUNTER — Other Ambulatory Visit: Payer: Self-pay

## 2021-01-15 ENCOUNTER — Emergency Department (HOSPITAL_COMMUNITY)
Admission: EM | Admit: 2021-01-15 | Discharge: 2021-01-15 | Disposition: A | Discharge status: Home / Self Care | Payer: Medicaid Other | Attending: Emergency Medicine | Admitting: Emergency Medicine

## 2021-01-15 DIAGNOSIS — F17210 Nicotine dependence, cigarettes, uncomplicated: Secondary | ICD-10-CM | POA: Insufficient documentation

## 2021-01-15 DIAGNOSIS — M545 Low back pain, unspecified: Secondary | ICD-10-CM | POA: Insufficient documentation

## 2021-01-15 DIAGNOSIS — R220 Localized swelling, mass and lump, head: Secondary | ICD-10-CM | POA: Insufficient documentation

## 2021-01-15 DIAGNOSIS — G8929 Other chronic pain: Secondary | ICD-10-CM | POA: Insufficient documentation

## 2021-01-15 MED ORDER — NAPROXEN 500 MG PO TABS: 500.0000 mg | ORAL_TABLET | Freq: Two times a day (BID) | ORAL | 0 refills | Status: DC

## 2021-01-15 MED ORDER — CYCLOBENZAPRINE HCL 10 MG PO TABS: 10.0000 mg | ORAL_TABLET | Freq: Two times a day (BID) | ORAL | 0 refills | Status: DC | PRN

## 2021-01-15 MED ORDER — HYDROCODONE-ACETAMINOPHEN 5-325 MG PO TABS: 1.0000 | ORAL_TABLET | Freq: Once | ORAL | Status: DC

## 2021-01-15 NOTE — Discharge Instructions (Addendum)
You were seen today for facial swelling and low back pain. For the facial swelling, please continue taking benadryl. You can also take pepcid as this will help reduce the swelling faster. Please return if you experience any difficulty swallowing, respiratory distress or rash.  For the low back pain, please pick up naproxen and flexeril at the pharmacy. Take naproxen once in the morning and once at night with food. Take the flexeril at night as it can make you sleepy. Please don't drive on the flexeril. If you have any difficulty urinating, numbness to the groin or legs, or develop a fever please return to the ED.   As dicussed, you should follow up regarding your back MRI on an outpatient basis. Please call the referral contact listed on this discharge instruction and set up an appointment.

## 2021-01-15 NOTE — ED Provider Notes (Signed)
  I saw this patient in conjunction with Theron Arista, PA-C.  Physical Exam Vitals and nursing note reviewed.  Constitutional:      General: He is not in acute distress.    Appearance: He is well-developed. He is not diaphoretic.  HENT:     Head: Normocephalic and atraumatic.  Eyes:     Conjunctiva/sclera: Conjunctivae normal.  Cardiovascular:     Rate and Rhythm: Normal rate and regular rhythm.     Pulses:          Posterior tibial pulses are 2+ on the right side and 2+ on the left side.  Pulmonary:     Effort: Pulmonary effort is normal.  Musculoskeletal:     Cervical back: Neck supple.       Back:     Comments: No deformity, instability, swelling, step-off.  No evidence of trauma.  Skin:    General: Skin is warm and dry.     Coloration: Skin is not pale.  Neurological:     Mental Status: He is alert.     Comments: Sensation grossly intact to light touch in the lower extremities bilaterally. No saddle anesthesias. Patient able to raise his legs off the bed and hold them in the air, equal bilaterally.   No noted gait deficit. Coordination intact.  Psychiatric:        Behavior: Behavior normal.     In review of the patient's chart, we noted a MRI lumbar spine from 2019 which, in addition to disc protrusions, noted a lesion with recommendation for follow-up imaging in 3 to 6 months.  Patient did not obtain this follow-up imaging. We discussed this finding as well as the recommendation for repeat MRI, which I think is appropriate for the outpatient setting.  He does not present with findings suggestive of neurosurgical emergency, such as cauda equina.  Vitals:   01/15/21 0727 01/15/21 0800 01/15/21 0830  BP: (!) 150/95 128/89 (!) 143/96  Pulse: 86 67 63  Resp: 18 18 18   Temp: 98.3 F (36.8 C)  97.8 F (36.6 C)  TempSrc: Oral  Oral  SpO2: 100% 100% 100%      01/15/21 0925    03/17/21, MD 01/15/21 1751

## 2021-01-15 NOTE — ED Provider Notes (Signed)
[facility] Provider Note   CSN: [id number] Arrival date & time: 01/15/21  0720     History Chief Complaint  Patient presents with  . Facial Swelling    [facility] is a 45 y.o. male.  The history is provided by the patient.    Patient is a 45 year old male with history of herniated discs status post discectomy with facial swelling to the right lip and lower back pain.   Facial swelling starting Saturday night. Reports he was smoking outside and noticed the swelling after. Swelling was initially to the upper lip. Unsure if he was bit by an insect. Took benadryl and states swelling reduced but it is still present to the right upper lip. He is only allergic to steroids to his knowledge. No difficulty swallowing. No rash. No nausea or vomiting.   The lower back pain started yesterday morning. He denies any recent trauma to his back or recent strenuous activity. He states pain started acutely and radiates bilaterally midway down his thigh, worse along the left leg. Pain is primarily localized to the lumbar spine along the site of previous surgery. Pain is described as 10/10. Pain is sharp, constant. Exacerbated by movement. Took aleve and tylenol without relief. Denies saddle anesthesia, loss of bowel function or numbness to the legs. Denies any fevers or chills.   Past Medical History:  Diagnosis Date  . Chronic back pain   . Tobacco abuse     Patient Active Problem List   Diagnosis Date Noted  . Nausea vomiting and diarrhea 02/18/2018  . Bradycardia 02/18/2018  . Near syncope 02/18/2018  . Chronic back pain   . Tobacco abuse     Past Surgical History:  Procedure Laterality Date  . BACK SURGERY         Family History  Problem Relation Age of Onset  . Hyperlipidemia Mother     Social History   Tobacco Use  . Smoking status: Current Every Day Smoker    Packs/day: 0.50    Types: Cigarettes  . Smokeless tobacco: Never Used   Substance Use Topics  . Alcohol use: Yes    Comment: occ  . Drug use: Not Currently    Home Medications Prior to Admission medications   Medication Sig Start Date End Date Taking? Authorizing Provider  ondansetron (ZOFRAN ODT) 8 MG disintegrating tablet Take 1 tablet (8 mg total) by mouth every 8 (eight) hours as needed for nausea or vomiting. 07/16/19   Margarita Grizzle, MD    Allergies    Prednisone and Ibuprofen  Review of Systems   Review of Systems All systems reviewed and are negative except as documented in history of present illness above.  Physical Exam Updated Vital Signs BP (!) 150/95 (BP Location: Right Arm)   Pulse 86   Temp 98.3 F (36.8 C) (Oral)   Resp 18   SpO2 100%   Physical Exam Vitals and nursing note reviewed. Exam conducted with a chaperone present.  Constitutional:      Appearance: Normal appearance.  HENT:     Head: Normocephalic and atraumatic.     Comments: Swelling to right upper lip. No angioedema. Airway in tact. Eyes:     General: No scleral icterus.       Right eye: No discharge.        Left eye: No discharge.     Extraocular Movements: Extraocular movements intact.     Pupils: Pupils are equal, round, and reactive  to light.  Cardiovascular:     Rate and Rhythm: Normal rate and regular rhythm.     Pulses: Normal pulses.     Heart sounds: Normal heart sounds. No murmur heard. No friction rub. No gallop.   Pulmonary:     Effort: Pulmonary effort is normal. No respiratory distress.     Breath sounds: Normal breath sounds.  Abdominal:     General: Abdomen is flat. Bowel sounds are normal. There is no distension.     Palpations: Abdomen is soft.     Tenderness: There is no abdominal tenderness.  Musculoskeletal:        General: Tenderness present. Normal range of motion.     Cervical back: Normal range of motion.     Comments: Surgical scar present along lumbar vertebrae. TTP along scar. No swelling, no erythema, no rash. No palpable  masses. Patient has full ROM to spine and neck. Strength is equal bilaterally to legs. Sensation intact.   Skin:    General: Skin is warm and dry.     Coloration: Skin is not jaundiced.     Findings: No erythema or rash.  Neurological:     General: No focal deficit present.     Mental Status: He is alert and oriented to person, place, and time. Mental status is at baseline.     Cranial Nerves: No cranial nerve deficit.     Sensory: No sensory deficit.     Motor: No weakness.     Coordination: Coordination normal.     Comments: CN II-XII in tact. No weakness. Sensation in tact. Grip strength equal bilaterally. Leg strength equal bilaterally. Positive straight leg test bilaterally.     ED Results / Procedures / Treatments   Labs (all labs ordered are listed, but only abnormal results are displayed) Labs Reviewed - No data to display  EKG None  Radiology No results found.  Procedures Procedures   Medications Ordered in ED Medications - No data to display  ED Course  I have reviewed the triage vital signs and the nursing notes.  Pertinent labs & imaging results that were available during my care of the patient were reviewed by me and considered in my medical decision making (see chart for details).    MDM Rules/Calculators/A&P                           Patient with chronic low back pain presents with facial swelling and low back pain. Nontoxic appearing, vitals stable. Neuro exam reassuring. No focal deficits. No saddle anesthesia. No fever. No leg numbness. No urinary retention. Facial swelling to right upper lip is not impressive. No angioedema, respiratory distress, rash or difficulty swallowing.   Additional history per chart review: Patient had an abnormal lumbar MRI in 2019 and was not seen for follow up. A lesion was found at L4 and needs follow up MRI with and without contrast.  Labs/Imaging: Given chronic nature of low back pain and reassuring PE, recommended  against emergent imaging of spine. Additionally, facial swelling is minor and not concerning. Follow up labs and imaging can be better handled in the outpatient setting.    A/P   Doubt cauda equina, spinal abscess, or CNS infectious given history, PE, and reassuring vitals. Can't rule out cancer, especially in context of unclear lumbar MRI completed in 2019 without followup. Will treat as if musculoskeletal etiology with antiinflammatory and muscle relaxers with plan to have patient follow up  with outpatient.   Low back pain - patient given neurosurgery referral. Advised to establish with [facility] and to have repeat lumbar MRI with and without contrast in the outpatient setting. Patient given naproxen and flexeril to take outpatient. ADRs discussed. Strict return precautions were given.  Facial swelling - continue taking benadryl and add famotidine to help reduce the swelling. Strict return precautions given.  Final Clinical Impression(s) / ED Diagnoses Final diagnoses:  None    Rx / DC Orders ED Discharge Orders    None       Theron Arista, PA-C 01/15/21 0901    Terrilee Files, MD 01/15/21 1750

## 2021-01-15 NOTE — ED Triage Notes (Signed)
Pt reports facial swelling since Sunday night. Pt airway intact. Pt reports now his back is hurting as well. Pt reports he took benadryl and the swelling reduced but didn't go away completely.

## 2022-03-06 ENCOUNTER — Encounter (HOSPITAL_COMMUNITY): Payer: Self-pay | Admitting: Emergency Medicine

## 2022-03-06 ENCOUNTER — Emergency Department (HOSPITAL_COMMUNITY)
Admission: EM | Admit: 2022-03-06 | Discharge: 2022-03-06 | Disposition: A | Discharge status: Home / Self Care | Payer: Self-pay | Attending: Emergency Medicine | Admitting: Emergency Medicine

## 2022-03-06 DIAGNOSIS — I10 Essential (primary) hypertension: Secondary | ICD-10-CM | POA: Insufficient documentation

## 2022-03-06 DIAGNOSIS — M5441 Lumbago with sciatica, right side: Secondary | ICD-10-CM | POA: Insufficient documentation

## 2022-03-06 HISTORY — DX: Essential (primary) hypertension: I10

## 2022-03-06 MED ORDER — NAPROXEN 500 MG PO TABS: 500.0000 mg | ORAL_TABLET | Freq: Two times a day (BID) | ORAL | 0 refills | Status: DC | PRN

## 2022-03-06 MED ORDER — PREDNISONE 20 MG PO TABS: 40.0000 mg | ORAL_TABLET | Freq: Every day | ORAL | 0 refills | Status: AC

## 2022-03-06 MED ORDER — LIDOCAINE 5 % EX PTCH: 1.0000 | MEDICATED_PATCH | CUTANEOUS | 0 refills | Status: AC

## 2022-03-06 MED ORDER — OXYCODONE-ACETAMINOPHEN 5-325 MG PO TABS
1.0000 | ORAL_TABLET | Freq: Once | ORAL | Status: AC
  Administered 2022-03-06: 1 via ORAL
  Filled 2022-03-06: qty 1

## 2022-03-06 MED ORDER — LIDOCAINE 5 % EX PTCH
1.0000 | MEDICATED_PATCH | CUTANEOUS | Status: DC
  Administered 2022-03-06: 1 via TRANSDERMAL
  Filled 2022-03-06: qty 1

## 2022-03-06 MED ORDER — CYCLOBENZAPRINE HCL 10 MG PO TABS: 10.0000 mg | ORAL_TABLET | Freq: Two times a day (BID) | ORAL | 0 refills | Status: AC | PRN

## 2022-03-06 NOTE — ED Triage Notes (Addendum)
Patient complains of return of lower back pain yesterday that had been mostly resolved since a spinal surgery six years ago. Patients complains of right leg numbness that started after onset of pain. Patient denies any known injury. Denies any new urinary or stool incontinence. Patient is alert, oriented, ambulatory, and in no apparent distress at this time.  Patient reports history of hypertension but states he is no longer medicated for it.

## 2022-03-06 NOTE — ED Provider Notes (Cosign Needed)
[facility] EMERGENCY DEPARTMENT Provider Note   CSN: [id number] Arrival date & time: 03/06/22  7782     History  Chief Complaint  Patient presents with   Back Pain    [facility] is a 46 y.o. male with history of hypertension, chronic back pain.  Presents emergency department with a chief complaint of right-sided back pain.  Patient reports that pain started yesterday.  Pain began after he returned from work.  Patient states that he works as a Production designer, theatre/television/film at [employer].  Denies any recent falls, traumatic injuries, heavy lifting.  Pain has been constant since starting.  Pain does not radiate from right lower back.  Patient states that he does have numbness to anterior right thigh.  Describes numbness as "pins-and-needles," sensation.  Patient has been taking muscle relaxer and Tylenol at home without improvement in his pain.  Patient reports that he had 2 herniated disks that were offered on surgically 7 years ago in [place].  Patient reports that he has not followed up with any neurosurgeon here locally.  Denies any fever, saddle anesthesia, bowel/bladder dysfunction, dysuria, hematuria, urinary urgency, IV drug use, history of malignancy.     Back Pain Associated symptoms: no abdominal pain, no chest pain, no dysuria, no fever and no headaches        Home Medications Prior to Admission medications   Medication Sig Start Date End Date Taking? Authorizing Provider  cyclobenzaprine (FLEXERIL) 10 MG tablet Take 1 tablet (10 mg total) by mouth 2 (two) times daily as needed for muscle spasms. 01/15/21   Theron Arista, PA-C  naproxen (NAPROSYN) 500 MG tablet Take 1 tablet (500 mg total) by mouth 2 (two) times daily. 01/15/21   Theron Arista, PA-C  ondansetron (ZOFRAN ODT) 8 MG disintegrating tablet Take 1 tablet (8 mg total) by mouth every 8 (eight) hours as needed for nausea or vomiting. 07/16/19   Margarita Grizzle, MD      Allergies    Prednisone and Ibuprofen     Review of Systems   Review of Systems  Constitutional:  Positive for chills. Negative for fever.  Respiratory:  Negative for shortness of breath.   Cardiovascular:  Negative for chest pain.  Gastrointestinal:  Negative for abdominal pain, nausea and vomiting.  Genitourinary:  Negative for difficulty urinating, dysuria, frequency, hematuria, penile discharge, penile pain, penile swelling, scrotal swelling and testicular pain.  Musculoskeletal:  Positive for back pain. Negative for neck pain.  Skin:  Negative for color change, pallor, rash and wound.  Neurological:  Negative for dizziness, syncope, light-headedness and headaches.  Psychiatric/Behavioral:  Negative for confusion.     Physical Exam Updated Vital Signs BP (!) 174/102 (BP Location: Right Arm)   Pulse 73   Temp 98.5 F (36.9 C) (Oral)   Resp 16   SpO2 98%  Physical Exam Vitals and nursing note reviewed.  Constitutional:      General: He is not in acute distress.    Appearance: He is not ill-appearing, toxic-appearing or diaphoretic.  HENT:     Head: Normocephalic.  Eyes:     General: No scleral icterus.       Right eye: No discharge.        Left eye: No discharge.  Cardiovascular:     Rate and Rhythm: Normal rate.     Pulses:          Dorsalis pedis pulses are 2+ on the right side and 2+ on the left side.  Pulmonary:     Effort: Pulmonary effort is normal.  Musculoskeletal:     Cervical back: No swelling, edema, deformity, erythema, signs of trauma, lacerations, rigidity, spasms, torticollis, tenderness, bony tenderness or crepitus. No pain with movement. Normal range of motion.     Thoracic back: No swelling, edema, deformity, signs of trauma, lacerations, spasms, tenderness or bony tenderness.     Lumbar back: Tenderness present. No edema, deformity, signs of trauma, lacerations, spasms or bony tenderness.     Comments: Well healed surgical incision over midline lumbar back.  Tenderness to right lumbar back  and paraspinous muscles.  Sensation intact to all digits of bilateral lower feet, cap refill less than 2 seconds in bilateral lower feet.  Skin:    General: Skin is warm and dry.  Neurological:     General: No focal deficit present.     Mental Status: He is alert and oriented to person, place, and time.     GCS: GCS eye subscore is 4. GCS verbal subscore is 5. GCS motor subscore is 6.     Comments: +5 strength to bilateral lower extremities.  Reports decree sensation to anterior right thigh.  Patient able to stand and ambulate without difficulty or foot drop.  Psychiatric:        Behavior: Behavior is cooperative.     ED Results / Procedures / Treatments   Labs (all labs ordered are listed, but only abnormal results are displayed) Labs Reviewed - No data to display  EKG None  Radiology No results found.  Procedures Procedures    Medications Ordered in ED Medications - No data to display  ED Course/ Medical Decision Making/ A&P                           Medical Decision Making Risk Prescription drug management.   Alert 46 year old male in no acute distress, nontoxic-appearing.  Presents to the emergency department with a chief complaint of back pain.  Information obtained from patient.  Past medical records were reviewed including previous provider notes, labs, and imaging.  Patient has medical history as outlined in HPI which complicates his care.  Patient patient's symptoms differential diagnosis includes but is not limited to cauda equina syndrome, epidural abscess, pyelonephritis, renal/ureteral calculus.  Low suspicion for cauda equina syndrome at this time as patient has no reports of bowel/bladder dysfunction and no weakness noted bilaterally.  Low suspicion for epidural abscess as patient is afebrile and no history of IV drug use.  Low suspicion for acute osseous abnormality as patient had no recent fall or traumatic injury.  Patient has history of chronic back  pain with previous surgery.  Based on patient's HPI and physical exam suspect possible sciatica.  Will prescribe patient with short course of prednisone, muscle relaxer, and lidocaine patch.  Patient given information to follow-up with neurosurgery in the outpatient setting for repeat assessment.  Patient noted to be hypertensive in the emergency department.  He states that he has a history of hypertension however was taken off medication 3 years prior by his PCP.  Patient's blood pressure may be elevated due to his acute back pain.  We will have him follow-up with PCP for further management of hypertension.  Patient care discussed with attending physician Dr. Rodena Medin.  Based on patient's chief complaint, I considered admission might be necessary, however after reassuring ED workup feel patient is reasonable for discharge.  Discussed results, findings, treatment and follow up.  Patient advised of return precautions. Patient verbalized understanding and agreed with plan.  Portions of this note were generated with Scientist, clinical (histocompatibility and immunogenetics). Dictation errors may occur despite best attempts at proofreading.         Final Clinical Impression(s) / ED Diagnoses Final diagnoses:  Acute right-sided low back pain with right-sided sciatica  Hypertension, unspecified type    Rx / DC Orders ED Discharge Orders          Ordered    predniSONE (DELTASONE) 20 MG tablet  Daily        03/06/22 0814    lidocaine (LIDODERM) 5 %  Every 24 hours        03/06/22 0814    naproxen (NAPROSYN) 500 MG tablet  Every 12 hours PRN        03/06/22 0814    cyclobenzaprine (FLEXERIL) 10 MG tablet  2 times daily PRN        03/06/22 0814              Haskel Schroeder, PA-C 03/06/22 0818

## 2022-03-06 NOTE — Discharge Instructions (Addendum)
You came to the emergency department today to be evaluated for your low back pain.  Your physical exam was reassuring.  Your symptoms are likely due to sciatica.  Due to this I have given you prescription for prednisone, naproxen, Flexeril, and lidocaine patches.  Please follow-up with the neurosurgery team listed on this paperwork for further evaluation.  While in the emergency department your blood pressure was found to be elevated.  Please follow-up with your primary care doctor for further management of your blood pressure.  I have given you a prescription for steroids today.  Some common side effects include feelings of extra energy, feeling warm, increased appetite, and stomach upset.  If you are diabetic your sugars may run higher than usual.   Today you received medications that may make you sleepy or impair your ability to make decisions.  For the next 24 hours please do not drive, operate heavy machinery, care for a small child with out another adult present, or perform any activities that may cause harm to you or someone else if you were to fall asleep or be impaired.   You are being prescribed a medication which may make you sleepy or mpair your ability to make decisions. Please follow up of listed precautions for at least 24 hours after taking one dose.  Please take Tylenol (acetaminophen) to relieve your pain.  You make take tylenol, up to 1,000 mg (two extra strength pills) every 8 hours as needed.  Do not take more than 3,000 mg tylenol in a 24 hour period (not more than one dose every 8 hours.  Please check all medication labels as many medications such as pain and cold medications may contain tylenol.  Do not drink alcohol while taking these medications.  Get help right away if: You are not able to control when you urinate or have bowel movements (incontinence). You have: Weakness in your lower back, pelvis, buttocks, or legs that gets worse. Redness or swelling of your back. A  burning sensation when you urinate.

## 2022-03-07 ENCOUNTER — Other Ambulatory Visit: Payer: Self-pay

## 2022-03-07 ENCOUNTER — Encounter (HOSPITAL_COMMUNITY): Payer: Self-pay

## 2022-03-07 ENCOUNTER — Emergency Department (HOSPITAL_COMMUNITY): Payer: Self-pay

## 2022-03-07 ENCOUNTER — Emergency Department (HOSPITAL_COMMUNITY)
Admission: EM | Admit: 2022-03-07 | Discharge: 2022-03-08 | Disposition: A | Discharge status: Home / Self Care | Payer: Self-pay | Attending: Emergency Medicine | Admitting: Emergency Medicine

## 2022-03-07 DIAGNOSIS — R1031 Right lower quadrant pain: Secondary | ICD-10-CM | POA: Insufficient documentation

## 2022-03-07 DIAGNOSIS — R112 Nausea with vomiting, unspecified: Secondary | ICD-10-CM | POA: Insufficient documentation

## 2022-03-07 DIAGNOSIS — I10 Essential (primary) hypertension: Secondary | ICD-10-CM | POA: Insufficient documentation

## 2022-03-07 DIAGNOSIS — M5441 Lumbago with sciatica, right side: Secondary | ICD-10-CM | POA: Insufficient documentation

## 2022-03-07 LAB — COMPREHENSIVE METABOLIC PANEL
ALT: 22 U/L (ref 0–44)
AST: 19 U/L (ref 15–41)
Albumin: 4.7 g/dL (ref 3.5–5.0)
Alkaline Phosphatase: 50 U/L (ref 38–126)
Anion gap: 12 (ref 5–15)
BUN: 9 mg/dL (ref 6–20)
CO2: 28 mmol/L (ref 22–32)
Calcium: 9.8 mg/dL (ref 8.9–10.3)
Chloride: 101 mmol/L (ref 98–111)
Creatinine, Ser: 0.95 mg/dL (ref 0.61–1.24)
GFR, Estimated: >60 mL/min (ref >60)
Glucose, Bld: 117 mg/dL — ABNORMAL HIGH (ref 70–99)
Potassium: 3.9 mmol/L (ref 3.5–5.1)
Sodium: 141 mmol/L (ref 135–145)
Total Bilirubin: 0.6 mg/dL (ref 0.3–1.2)
Total Protein: 7.6 g/dL (ref 6.5–8.1)

## 2022-03-07 LAB — URINALYSIS, ROUTINE W REFLEX MICROSCOPIC
Bilirubin Urine: NEGATIVE
Glucose, UA: NEGATIVE mg/dL
Hgb urine dipstick: NEGATIVE
Ketones, ur: NEGATIVE mg/dL
Leukocytes,Ua: NEGATIVE
Nitrite: NEGATIVE
Protein, ur: 30 mg/dL — AB
Specific Gravity, Urine: 1.021 (ref 1.005–1.030)
pH: 6 (ref 5.0–8.0)

## 2022-03-07 LAB — CBC
HCT: 44.4 % (ref 39.0–52.0)
Hemoglobin: 15 g/dL (ref 13.0–17.0)
MCH: 31.4 pg (ref 26.0–34.0)
MCHC: 33.8 g/dL (ref 30.0–36.0)
MCV: 93.1 fL (ref 80.0–100.0)
Platelets: 334 10*3/uL (ref 150–400)
RBC: 4.77 MIL/uL (ref 4.22–5.81)
RDW: 13.4 % (ref 11.5–15.5)
WBC: 11.7 10*3/uL — ABNORMAL HIGH (ref 4.0–10.5)
nRBC: 0 % (ref 0.0–0.2)

## 2022-03-07 LAB — LIPASE, BLOOD: Lipase: 25 U/L (ref 11–51)

## 2022-03-07 MED ORDER — ONDANSETRON 4 MG PO TBDP
4.0000 mg | ORAL_TABLET | Freq: Once | ORAL | Status: AC
  Administered 2022-03-07: 4 mg via ORAL
  Filled 2022-03-07: qty 1

## 2022-03-07 MED ORDER — ONDANSETRON HCL 4 MG/2ML IJ SOLN: 4.0000 mg | Freq: Once | INTRAMUSCULAR | Status: DC

## 2022-03-07 MED ORDER — SODIUM CHLORIDE 0.9 % IV BOLUS: 1000.0000 mL | Freq: Once | INTRAVENOUS | Status: DC

## 2022-03-07 MED ORDER — IOHEXOL 300 MG/ML  SOLN
80.0000 mL | Freq: Once | INTRAMUSCULAR | Status: AC | PRN
  Administered 2022-03-07: 80 mL via INTRAVENOUS

## 2022-03-07 NOTE — ED Provider Triage Note (Addendum)
Emergency Medicine Provider Triage Evaluation Note  [facility] , a 46 y.o. male  was evaluated in triage.  Pt complains of right lower quadrant abdominal pain that radiates to the right lower back.  He reports associated nausea and intractable vomiting since this morning.  He is also having diaphoresis.  No urinary complaints.  No diarrhea.  Review of Systems  Positive:  Negative: See above   Physical Exam  BP (!) 166/117 (BP Location: Right Arm)   Pulse 65   Temp 99.3 F (37.4 C) (Oral)   Resp 18   SpO2 100%  Gen:   Awake, no distress   Resp:  Normal effort  MSK:   Moves extremities without difficulty  Other:  Right lower quadrant tenderness.  Medical Decision Making  Medically screening exam initiated at 5:21 PM.  Appropriate orders placed.  Holdyn Bergamo was informed that the remainder of the evaluation will be completed by another provider, this initial triage assessment does not replace that evaluation, and the importance of remaining in the ED until their evaluation is complete.  Patient is diaphoretic in triage and actively vomiting.  I am concerned for possible appendicitis.     Teressa Lower, PA-C 03/07/22 1721    Honor Loh M, PA-C 03/07/22 1724

## 2022-03-07 NOTE — ED Triage Notes (Signed)
Pt c/o N/V, sweats and lower back painx3 days. Pt denies urinary sx. Pt denies diarrhea, but hasn't had a Bmx3 days.

## 2022-03-08 MED ORDER — ONDANSETRON 8 MG PO TBDP: 8.0000 mg | ORAL_TABLET | Freq: Three times a day (TID) | ORAL | 0 refills | Status: AC | PRN

## 2022-03-08 MED ORDER — LACTATED RINGERS IV BOLUS
1000.0000 mL | Freq: Once | INTRAVENOUS | Status: AC
  Administered 2022-03-08: 1000 mL via INTRAVENOUS

## 2022-03-08 MED ORDER — METHOCARBAMOL 1000 MG/10ML IJ SOLN
1000.0000 mg | Freq: Once | INTRAVENOUS | Status: AC
  Administered 2022-03-08: 1000 mg via INTRAVENOUS
  Filled 2022-03-08 (×2): qty 10

## 2022-03-08 MED ORDER — CELECOXIB 100 MG PO CAPS: 100.0000 mg | ORAL_CAPSULE | Freq: Two times a day (BID) | ORAL | 0 refills | Status: AC

## 2022-03-08 MED ORDER — ONDANSETRON HCL 4 MG/2ML IJ SOLN
4.0000 mg | Freq: Once | INTRAMUSCULAR | Status: AC
  Administered 2022-03-08: 4 mg via INTRAVENOUS
  Filled 2022-03-08: qty 2

## 2022-03-08 MED ORDER — KETOROLAC TROMETHAMINE 30 MG/ML IJ SOLN
30.0000 mg | Freq: Once | INTRAMUSCULAR | Status: AC
  Administered 2022-03-08: 30 mg via INTRAVENOUS
  Filled 2022-03-08: qty 1

## 2022-03-11 ENCOUNTER — Encounter: Payer: Self-pay | Admitting: Family Medicine

## 2022-03-11 ENCOUNTER — Ambulatory Visit (INDEPENDENT_AMBULATORY_CARE_PROVIDER_SITE_OTHER): Payer: Self-pay | Admitting: Family Medicine

## 2022-03-11 VITALS — BP 112/80 | Ht 77.0 in | Wt 199.0 lb

## 2022-03-11 DIAGNOSIS — M5416 Radiculopathy, lumbar region: Secondary | ICD-10-CM

## 2022-03-11 DIAGNOSIS — G959 Disease of spinal cord, unspecified: Secondary | ICD-10-CM | POA: Insufficient documentation

## 2022-03-11 MED ORDER — HYDROCODONE-ACETAMINOPHEN 5-325 MG PO TABS: 1.0000 | ORAL_TABLET | Freq: Three times a day (TID) | ORAL | 0 refills | Status: DC | PRN

## 2022-03-11 NOTE — Progress Notes (Signed)
  [facility] - 46 y.o. male MRN [id number]  Date of birth: Jun 03, 1976  SUBJECTIVE:  Including CC & ROS.  No chief complaint on file.   [facility] is a 46 y.o. male that is  presenting with acute severe right lower back pain and right altered sensation of the thigh. History of lower back surgery. No injury or inciting event.  Review of the emergency department note from 6/22 shows he was provided prednisone, paroxetine and Flexeril.  Review of the emergency department note from 6/23 shows he was provided Celebrex and Zofran. Independent review of the CT abdomen and pelvis from 6/23 shows no significant lumbar changes.  Review of Systems See HPI   HISTORY: Past Medical, Surgical, Social, and Family History Reviewed & Updated per EMR.   Pertinent Historical Findings include:  Past Medical History:  Diagnosis Date   Chronic back pain    Hypertension    Tobacco abuse     Past Surgical History:  Procedure Laterality Date   BACK SURGERY       PHYSICAL EXAM:  VS: BP 112/80 (BP Location: Left Arm, Patient Position: Sitting)   Ht 6\' 5"  (1.956 m)   Wt 199 lb (90.3 kg)   BMI 23.60 kg/m  Physical Exam Gen: NAD, alert, cooperative with exam, well-appearing MSK:  Neurovascularly intact       ASSESSMENT & PLAN:   Lumbar radiculopathy Acutely occurring.  Having severe right low back pain with altered sensation and pain in the right groin and thigh.  Has a history of microdiscectomy from 7 years ago.  CT scan of the lumbar spine was reassuring.  Most consistent with a nerve impingement. -Counseled on home exercise therapy and supportive care. -Counseled on Celebrex and Tylenol. -Norco. -Could consider further imaging or physical therapy.

## 2022-03-21 ENCOUNTER — Ambulatory Visit (INDEPENDENT_AMBULATORY_CARE_PROVIDER_SITE_OTHER): Payer: Self-pay | Admitting: Family Medicine

## 2022-03-21 VITALS — BP 140/82 | Ht 77.0 in | Wt 199.0 lb

## 2022-03-21 DIAGNOSIS — G959 Disease of spinal cord, unspecified: Secondary | ICD-10-CM

## 2022-03-21 MED ORDER — HYDROCODONE-ACETAMINOPHEN 5-325 MG PO TABS: 1.0000 | ORAL_TABLET | Freq: Three times a day (TID) | ORAL | 0 refills | Status: AC | PRN

## 2022-03-21 MED ORDER — IBUPROFEN 600 MG PO TABS: 600.0000 mg | ORAL_TABLET | Freq: Three times a day (TID) | ORAL | 1 refills | Status: AC | PRN

## 2022-03-21 NOTE — Patient Instructions (Signed)
Good to see you  Please use heat as needed. Please use the ibuprofen with food. Please use the pain medicine as needed. We will obtain MRI at [facility] imaging  Please send me a message in MyChart with any questions or updates.  We will set up a virtual visit once the MRI is resulted.  --Dr. Jordan Likes

## 2022-03-21 NOTE — Progress Notes (Signed)
  [facility] - 46 y.o. male MRN [id number]  Date of birth: 03-25-1976  SUBJECTIVE:  Including CC & ROS.  No chief complaint on file.   [facility] is a 46 y.o. male that is presenting with worsening of his right anterior thigh pain and altered sensation of his groin.  No improvement with medications and symptoms have worsened.    Review of Systems See HPI   HISTORY: Past Medical, Surgical, Social, and Family History Reviewed & Updated per EMR.   Pertinent Historical Findings include:  Past Medical History:  Diagnosis Date   Chronic back pain    Hypertension    Tobacco abuse     Past Surgical History:  Procedure Laterality Date   BACK SURGERY       PHYSICAL EXAM:  VS: BP 140/82 (BP Location: Left Arm, Patient Position: Sitting)   Ht 6\' 5"  (1.956 m)   Wt 199 lb (90.3 kg)   BMI 23.60 kg/m  Physical Exam Gen: NAD, alert, cooperative with exam, well-appearing MSK:  Right leg: Limited flexion of the back. Weakness to resistance with hip flexion. Weakness with resistance to knee extension. Diminished right reflexes at the patella and Achilles. Positive straight leg raise on the right.     ASSESSMENT & PLAN:   Lumbar myelopathy (HCC) Acutely worsening with severe pain and worsening altered sensation and weakness in the right leg.  Has a history of surgery.  Concerning for significant compression due to the altered reflexes and weakness on exam.  Recent CT scan of the abdomen pelvis was unrevealing for any structural changes. -Counseled on home exercise therapy and supportive care. -Refilled Norco. -Ibuprofen. -MRI of the lumbar spine to evaluate for cord compression for presurgical planning.

## 2022-03-21 NOTE — Assessment & Plan Note (Signed)
Acutely worsening with severe pain and worsening altered sensation and weakness in the right leg.  Has a history of surgery.  Concerning for significant compression due to the altered reflexes and weakness on exam.  Recent CT scan of the abdomen pelvis was unrevealing for any structural changes. -Counseled on home exercise therapy and supportive care. -Refilled Norco. -Ibuprofen. -MRI of the lumbar spine to evaluate for cord compression for presurgical planning.

## 2023-01-01 ENCOUNTER — Encounter: Payer: Self-pay | Admitting: *Deleted
